# Patient Record
Sex: Female | Born: 1950 | Race: Black or African American | Hispanic: No | Smoking: Current every day smoker
Health system: Southern US, Community
[De-identification: ages and names within clinical notes are randomized; demographics above are authoritative.]

## PROBLEM LIST (undated history)

## (undated) DIAGNOSIS — I10 Essential (primary) hypertension: Secondary | ICD-10-CM

## (undated) DIAGNOSIS — M549 Dorsalgia, unspecified: Secondary | ICD-10-CM

## (undated) DIAGNOSIS — E7800 Pure hypercholesterolemia, unspecified: Secondary | ICD-10-CM

## (undated) DIAGNOSIS — R7303 Prediabetes: Secondary | ICD-10-CM

## (undated) DIAGNOSIS — M1990 Unspecified osteoarthritis, unspecified site: Secondary | ICD-10-CM

## (undated) HISTORY — PX: TUBAL LIGATION: SHX77

## (undated) HISTORY — DX: Essential (primary) hypertension: I10

## (undated) HISTORY — DX: Pure hypercholesterolemia, unspecified: E78.00

---

## 2004-09-02 ENCOUNTER — Ambulatory Visit: Payer: Self-pay | Admitting: Family Medicine

## 2006-01-29 ENCOUNTER — Ambulatory Visit: Payer: Self-pay | Admitting: Family Medicine

## 2007-03-18 ENCOUNTER — Ambulatory Visit: Payer: Self-pay | Admitting: Nurse Practitioner

## 2009-03-29 ENCOUNTER — Ambulatory Visit: Payer: Self-pay | Admitting: Nurse Practitioner

## 2010-04-10 ENCOUNTER — Ambulatory Visit: Payer: Self-pay | Admitting: Nurse Practitioner

## 2011-06-05 ENCOUNTER — Ambulatory Visit: Payer: Self-pay | Admitting: Nurse Practitioner

## 2012-02-19 ENCOUNTER — Emergency Department (HOSPITAL_COMMUNITY)
Admission: EM | Admit: 2012-02-19 | Discharge: 2012-02-19 | Disposition: A | Discharge status: Home / Self Care | Payer: BC Managed Care – PPO | Attending: Emergency Medicine | Admitting: Emergency Medicine

## 2012-02-19 ENCOUNTER — Emergency Department (HOSPITAL_COMMUNITY): Payer: BC Managed Care – PPO

## 2012-02-19 ENCOUNTER — Encounter (HOSPITAL_COMMUNITY): Payer: Self-pay | Admitting: *Deleted

## 2012-02-19 DIAGNOSIS — Z79899 Other long term (current) drug therapy: Secondary | ICD-10-CM | POA: Insufficient documentation

## 2012-02-19 DIAGNOSIS — F17200 Nicotine dependence, unspecified, uncomplicated: Secondary | ICD-10-CM | POA: Insufficient documentation

## 2012-02-19 DIAGNOSIS — M25569 Pain in unspecified knee: Secondary | ICD-10-CM | POA: Insufficient documentation

## 2012-02-19 DIAGNOSIS — M25562 Pain in left knee: Secondary | ICD-10-CM

## 2012-02-19 MED ORDER — PREDNISONE 20 MG PO TABS: ORAL_TABLET | ORAL | Status: DC

## 2012-02-19 MED ORDER — NAPROXEN 500 MG PO TABS: 500.0000 mg | ORAL_TABLET | Freq: Two times a day (BID) | ORAL | Status: AC

## 2012-02-19 NOTE — ED Provider Notes (Signed)
History  This chart was scribed for Christina Hutching, MD by Shari Heritage, ED Scribe. The patient was seen in room APFT24/APFT24. Patient's care was started at 1417.  CSN: [id number]  Arrival date & time 02/19/12  1322   First MD Initiated Contact with Patient 02/19/12 1417      Chief Complaint  Patient presents with  . Knee Pain     The history is provided by the patient. No language interpreter was used.     HPI Comments: Christina Erickson is a 62 y.o. female who presents to the Emergency Department complaining of moderate, constant, non-radiating, left knee pain onset more than 3 weeks ago. Patient says that pain began worsening last week. Patient was seen by Dr. Jarold Motto [facility] practice) in [place] and she got a steroid shot that did not relieve pain. She did not have an x-ray at that time. She denies any obvious injury or trauma to the knee. She reports no other significant past medical or surgical history.  History reviewed. No pertinent past medical history.  History reviewed. No pertinent past surgical history.  No family history on file.  History  Substance Use Topics  . Smoking status: Current Every Day Smoker  . Smokeless tobacco: Not on file  . Alcohol Use: No    OB History    Grav Para Term Preterm Abortions TAB SAB Ect Mult Living                  Review of Systems A complete 10 system review of systems was obtained and all systems are negative except as noted in the HPI and PMH.   Allergies  Review of patient's allergies indicates no known allergies.  Home Medications   Current Outpatient Rx  Name  Route  Sig  Dispense  Refill  . ACETAMINOPHEN 500 MG PO TABS   Oral   Take 1,000 mg by mouth every 6 (six) hours as needed. Pain         . BENAZEPRIL HCL 40 MG PO TABS   Oral   Take 40 mg by mouth daily.         Marland Kitchen CALCIUM CARBONATE-VITAMIN D 500-200 MG-UNIT PO TABS   Oral   Take 1 tablet by mouth daily.         Marland Kitchen PRAVASTATIN SODIUM 40 MG PO  TABS   Oral   Take 40 mg by mouth daily.         Marland Kitchen ROSUVASTATIN CALCIUM 10 MG PO TABS   Oral   Take 5 mg by mouth daily.           Triage Vitals: BP 158/80  Pulse 91  Temp 98.1 F (36.7 C)  Resp 20  SpO2 100%  Physical Exam  Constitutional: She is oriented to person, place, and time. She appears well-developed and well-nourished.  HENT:  Head: Normocephalic and atraumatic.  Musculoskeletal: Normal range of motion.       Very minimal tenderness to left knee with mild swelling with ROM. No swelling to knee.  Neurological: She is alert and oriented to person, place, and time.  Skin: Skin is warm and dry. No rash noted.  Psychiatric: She has a normal mood and affect. Her behavior is normal.    ED Course  Procedures (including critical care time) DIAGNOSTIC STUDIES: Oxygen Saturation is 100% on room air, normal by my interpretation.    COORDINATION OF CARE: 2:38 PM- Patient informed of current plan for treatment and evaluation and agrees with  plan at this time.    Dg Knee Complete 4 Views Left  02/19/2012  *RADIOLOGY REPORT*  Clinical Data: Posterior knee pain, swelling  LEFT KNEE - COMPLETE 4+ VIEW  Comparison: None.  Findings: No fracture or dislocation is seen.  The joint spaces are preserved.  The visualized soft tissues are unremarkable.  IMPRESSION: No acute osseous abnormality is seen.   Original Report Authenticated By: Charline Bills, M.D.      No diagnosis found.    MDM  X-ray left knee negative.   Referral to orthopedics      I personally performed the services described in this documentation, which was scribed in my presence. The recorded information has been reviewed and is accurate.    Christina Hutching, MD 02/20/12 [phone]

## 2012-02-19 NOTE — ED Notes (Signed)
Pt c/o left knee pain since before christmas, pain became worse last week, was seen by Dr. Jarold Motto in [place], given cortisone shot with no improvement in pain, denies any injury, cms intact distal to left knee

## 2012-02-19 NOTE — ED Notes (Signed)
Pt reports has been having pain in left knee since before Christmas.  Reports received steroid injection last week.  Pt says pain is still severe.  Hurts to bear any weight.

## 2012-03-09 ENCOUNTER — Ambulatory Visit (INDEPENDENT_AMBULATORY_CARE_PROVIDER_SITE_OTHER): Payer: BC Managed Care – PPO | Admitting: Orthopedic Surgery

## 2012-03-09 ENCOUNTER — Encounter: Payer: Self-pay | Admitting: Orthopedic Surgery

## 2012-03-09 VITALS — BP 152/68 | Ht 62.5 in | Wt 151.0 lb

## 2012-03-09 DIAGNOSIS — M1712 Unilateral primary osteoarthritis, left knee: Secondary | ICD-10-CM | POA: Insufficient documentation

## 2012-03-09 DIAGNOSIS — M1710 Unilateral primary osteoarthritis, unspecified knee: Secondary | ICD-10-CM

## 2012-03-09 MED ORDER — DICLOFENAC POTASSIUM 50 MG PO TABS: 50.0000 mg | ORAL_TABLET | Freq: Two times a day (BID) | ORAL | Status: DC

## 2012-03-09 MED ORDER — TRAMADOL-ACETAMINOPHEN 37.5-325 MG PO TABS: 1.0000 | ORAL_TABLET | ORAL | Status: DC | PRN

## 2012-03-09 NOTE — Patient Instructions (Addendum)
Wear and Tear Disorders of the Knee (Arthritis, Osteoarthritis)  Everyone will experience wear and tear injuries (arthritis, osteoarthritis) of the knee. These are the changes we all get as we age. They come from the joint stress of daily living. The amount of cartilage damage in your knee and your symptoms determine if you need surgery. Mild problems require approximately two months recovery time. More severe problems take several months to recover. With mild problems, your surgeon may find worn and rough cartilage surfaces. With severe changes, your surgeon may find cartilage that has completely worn away and exposed the bone. Loose bodies of bone and cartilage, bone spurs (excess bone growth), and injuries to the menisci (cushions between the large bones of your leg) are also common. All of these problems can cause pain.  For a mild wear and tear problem, rough cartilage may simply need to be shaved and smoothed. For more severe problems with areas of exposed bone, your surgeon may use an instrument for roughing up the bone surfaces to stimulate new cartilage growth. Loose bodies are usually removed. Torn menisci may be trimmed or repaired.  ABOUT THE ARTHROSCOPIC PROCEDURE  Arthroscopy is a surgical technique. It allows your orthopedic surgeon to diagnose and treat your knee injury with accuracy. The surgeon looks into your knee through a small scope. The scope is like a small (pencil-sized) telescope. Arthroscopy is less invasive than open knee surgery. You can expect a more rapid recovery. After the procedure, you will be moved to a recovery area until most of the effects of the medication have worn off. Your caregiver will discuss the test results with you.  RECOVERY  The severity of the arthritis and the type of procedure performed will determine recovery time. Other important factors include age, physical condition, medical conditions, and the type of rehabilitation program. Strengthening your muscles after  arthroscopy helps guarantee a better recovery. Follow your caregiver's instructions. Use crutches, rest, elevate, ice, and do knee exercises as instructed. Your caregivers will help you and instruct you with exercises and other physical therapy required to regain your mobility, muscle strength, and functioning following surgery. Only take over-the-counter or prescription medicines for pain, discomfort, or fever as directed by your caregiver.   SEEK MEDICAL CARE IF:    There is increased bleeding (more than a small spot) from the wound.   You notice redness, swelling, or increasing pain in the wound.   Pus is coming from wound.   You develop an unexplained oral temperature above 102 F (38.9 C) , or as your caregiver suggests.   You notice a foul smell coming from the wound or dressing.   You have severe pain with motion of the knee.  SEEK IMMEDIATE MEDICAL CARE IF:    You develop a rash.   You have difficulty breathing.   You have any allergic problems.  MAKE SURE YOU:    Understand these instructions.   Will watch your condition.   Will get help right away if you are not doing well or get worse.  Document Released: 01/18/2000 Document Revised: 04/14/2011 Document Reviewed: 06/16/2007  ExitCare Patient Information 2013 [employer].

## 2012-03-09 NOTE — Progress Notes (Signed)
Patient ID: Christina Erickson, female   DOB: [birth year], 62 y.o.   MRN: [id number] Chief Complaint  Patient presents with  . Knee Pain    Left knee pain, no injury. Xrays at [facility].    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 She denies catching or locking but had an episode giving way she says she can negotiate this care stairs okay but she does have anterior knee pain which is exacerbated with the knee flexed. In terms of her review of systems she does report a history of fatigue and blurred vision snoring heartburn and nausea constipation, numbness temperature intolerance and joint pain the remaining systems of the 14 reviewed were negative by patient reported questionnaire  She has a history of hypertension and some high cholesterol  She did have a dose of steroids, some Naprosyn and an x-ray at the ER x-ray did not show any acute abnormality.  BP 152/68  Ht 5' 2.5" (1.588 m)  Wt 151 lb (68.493 kg)  BMI 27.18 kg/m2 Physical Exam(12)  Vital signs:   GENERAL: normal development   CDV: pulses are normal   Skin: normal  Lymph: nodes were not palpable/normal  Psychiatric: awake, alert and oriented  Neuro: normal sensation  MSK  Gait: Ambulation is without assistive device 1 Inspection of the left knee reveals significant patellofemoral crepitance positive quadriceps contraction in addition and tenderness in the medial facet of the patella with no medial or lateral joint line tenderness and no joint effusion. Range of motion remains full and she has full motor strength in all ligaments were stable  The right knee reveals crepitance on range of motion but no pain no tenderness no swelling full range of motion normal strength and stability.  Upper extremity exam  The right and left upper extremity:   Inspection and palpation revealed no  abnormalities in the upper extremities.   Range of motion is full without contracture.  Motor exam is normal with grade 5 strength.  The joints are fully reduced without subluxation.  There is no atrophy or tremor and muscle tone is normal.  All joints are stable.    Imaging I have reviewed the images from the hospital and the knee looks normal  Assessment: Medical decision-making New diagnosis of patellofemoral arthritis  Interpreted image and review ER records. Your records confirm the patient's history as she describes.  Prescription medication Ultracet and diclofenac with appropriate cautions  Explanation of disease process and natural history  The patient will followup as needed

## 2012-06-29 ENCOUNTER — Ambulatory Visit: Payer: BC Managed Care – PPO | Admitting: Orthopedic Surgery

## 2012-06-30 ENCOUNTER — Encounter: Payer: Self-pay | Admitting: Orthopedic Surgery

## 2012-09-11 ENCOUNTER — Ambulatory Visit: Payer: Self-pay | Admitting: Podiatry

## 2012-09-13 ENCOUNTER — Ambulatory Visit: Payer: Self-pay | Admitting: Nurse Practitioner

## 2013-01-21 ENCOUNTER — Ambulatory Visit: Payer: Self-pay | Admitting: Podiatry

## 2013-07-22 ENCOUNTER — Ambulatory Visit: Payer: Self-pay | Admitting: Unknown Physician Specialty

## 2013-07-25 LAB — PATHOLOGY REPORT

## 2013-11-11 ENCOUNTER — Ambulatory Visit: Payer: Self-pay | Admitting: Nurse Practitioner

## 2017-05-01 ENCOUNTER — Other Ambulatory Visit: Payer: Self-pay | Admitting: Student

## 2017-05-01 DIAGNOSIS — M5440 Lumbago with sciatica, unspecified side: Principal | ICD-10-CM

## 2017-05-01 DIAGNOSIS — G8929 Other chronic pain: Secondary | ICD-10-CM

## 2017-05-12 ENCOUNTER — Ambulatory Visit
Admission: RE | Admit: 2017-05-12 | Discharge: 2017-05-12 | Disposition: A | Discharge status: Home / Self Care | Payer: Medicare Other | Source: Ambulatory Visit | Attending: Student | Admitting: Student

## 2017-05-12 DIAGNOSIS — M5440 Lumbago with sciatica, unspecified side: Secondary | ICD-10-CM | POA: Insufficient documentation

## 2017-05-12 DIAGNOSIS — Z859 Personal history of malignant neoplasm, unspecified: Secondary | ICD-10-CM | POA: Diagnosis not present

## 2017-05-12 DIAGNOSIS — M48061 Spinal stenosis, lumbar region without neurogenic claudication: Secondary | ICD-10-CM | POA: Diagnosis not present

## 2017-05-12 DIAGNOSIS — M5126 Other intervertebral disc displacement, lumbar region: Secondary | ICD-10-CM | POA: Insufficient documentation

## 2017-05-12 DIAGNOSIS — G8929 Other chronic pain: Secondary | ICD-10-CM | POA: Diagnosis not present

## 2017-06-22 ENCOUNTER — Other Ambulatory Visit: Payer: Self-pay | Admitting: Student

## 2017-06-22 DIAGNOSIS — M47816 Spondylosis without myelopathy or radiculopathy, lumbar region: Secondary | ICD-10-CM

## 2017-06-25 ENCOUNTER — Ambulatory Visit
Admission: RE | Admit: 2017-06-25 | Discharge: 2017-06-25 | Disposition: A | Discharge status: Home / Self Care | Payer: Medicare Other | Source: Ambulatory Visit | Attending: Student | Admitting: Student

## 2017-06-25 DIAGNOSIS — M549 Dorsalgia, unspecified: Secondary | ICD-10-CM

## 2017-06-25 DIAGNOSIS — M47817 Spondylosis without myelopathy or radiculopathy, lumbosacral region: Secondary | ICD-10-CM | POA: Diagnosis present

## 2017-06-25 DIAGNOSIS — M47816 Spondylosis without myelopathy or radiculopathy, lumbar region: Secondary | ICD-10-CM

## 2017-06-25 HISTORY — DX: Unspecified osteoarthritis, unspecified site: M19.90

## 2017-06-25 HISTORY — PX: IR FLUORO GUIDED NEEDLE PLC ASPIRATION/INJECTION LOC: IMG2395

## 2017-06-25 HISTORY — DX: Dorsalgia, unspecified: M54.9

## 2017-06-25 MED ORDER — HYDROCODONE-ACETAMINOPHEN 5-325 MG PO TABS: 1.0000 | ORAL_TABLET | ORAL | Status: DC | PRN

## 2017-06-25 MED ORDER — METHYLPREDNISOLONE ACETATE 80 MG/ML IJ SUSP
160.0000 mg | Freq: Once | INTRAMUSCULAR | Status: DC
  Filled 2017-06-25: qty 2

## 2017-06-25 NOTE — Procedures (Signed)
  Procedure: R L5-S1 facet injection under fluoro  depomedrol 1.23ml Lido 1% EBL:   minimal Complications:  none immediate  See full dictation in [employer].  Thora Lance MD Main # [phone] Pager  [phone]

## 2017-06-26 ENCOUNTER — Encounter: Payer: Self-pay | Admitting: Interventional Radiology

## 2018-02-12 ENCOUNTER — Emergency Department (HOSPITAL_COMMUNITY): Payer: Medicare Other

## 2018-02-12 ENCOUNTER — Other Ambulatory Visit: Payer: Self-pay

## 2018-02-12 ENCOUNTER — Encounter (HOSPITAL_COMMUNITY): Payer: Self-pay | Admitting: *Deleted

## 2018-02-12 ENCOUNTER — Emergency Department (HOSPITAL_COMMUNITY)
Admission: EM | Admit: 2018-02-12 | Discharge: 2018-02-12 | Disposition: A | Discharge status: Home / Self Care | Payer: Medicare Other | Attending: Emergency Medicine | Admitting: Emergency Medicine

## 2018-02-12 DIAGNOSIS — M5136 Other intervertebral disc degeneration, lumbar region: Secondary | ICD-10-CM | POA: Insufficient documentation

## 2018-02-12 DIAGNOSIS — J111 Influenza due to unidentified influenza virus with other respiratory manifestations: Secondary | ICD-10-CM | POA: Diagnosis not present

## 2018-02-12 DIAGNOSIS — M545 Low back pain, unspecified: Secondary | ICD-10-CM

## 2018-02-12 DIAGNOSIS — I10 Essential (primary) hypertension: Secondary | ICD-10-CM | POA: Diagnosis not present

## 2018-02-12 LAB — URINALYSIS, ROUTINE W REFLEX MICROSCOPIC
Bacteria, UA: NONE SEEN
Bilirubin Urine: NEGATIVE
Glucose, UA: NEGATIVE mg/dL
Ketones, ur: 20 mg/dL — AB
Leukocytes, UA: NEGATIVE
Nitrite: NEGATIVE
Protein, ur: NEGATIVE mg/dL
Specific Gravity, Urine: 1.016 (ref 1.005–1.030)
pH: 5 (ref 5.0–8.0)

## 2018-02-12 LAB — CBC WITH DIFFERENTIAL/PLATELET
Abs Immature Granulocytes: 0.02 10*3/uL (ref 0.00–0.07)
Basophils Absolute: 0 10*3/uL (ref 0.0–0.1)
Basophils Relative: 0 %
Eosinophils Absolute: 0 10*3/uL (ref 0.0–0.5)
Eosinophils Relative: 0 %
HCT: 41.1 % (ref 36.0–46.0)
Hemoglobin: 13.1 g/dL (ref 12.0–15.0)
Immature Granulocytes: 0 %
Lymphocytes Relative: 16 %
Lymphs Abs: 1.6 10*3/uL (ref 0.7–4.0)
MCH: 29 pg (ref 26.0–34.0)
MCHC: 31.9 g/dL (ref 30.0–36.0)
MCV: 90.9 fL (ref 80.0–100.0)
Monocytes Absolute: 0.4 10*3/uL (ref 0.1–1.0)
Monocytes Relative: 4 %
Neutro Abs: 7.9 10*3/uL — ABNORMAL HIGH (ref 1.7–7.7)
Neutrophils Relative %: 80 %
Platelets: 286 10*3/uL (ref 150–400)
RBC: 4.52 MIL/uL (ref 3.87–5.11)
RDW: 14.4 % (ref 11.5–15.5)
WBC: 9.9 10*3/uL (ref 4.0–10.5)
nRBC: 0 % (ref 0.0–0.2)

## 2018-02-12 LAB — COMPREHENSIVE METABOLIC PANEL
ALT: 30 U/L (ref 0–44)
AST: 22 U/L (ref 15–41)
Albumin: 4.3 g/dL (ref 3.5–5.0)
Alkaline Phosphatase: 87 U/L (ref 38–126)
Anion gap: 9 (ref 5–15)
BUN: 13 mg/dL (ref 8–23)
CO2: 25 mmol/L (ref 22–32)
Calcium: 9.7 mg/dL (ref 8.9–10.3)
Chloride: 102 mmol/L (ref 98–111)
Creatinine, Ser: 0.64 mg/dL (ref 0.44–1.00)
GFR calc Af Amer: >60 mL/min (ref >60)
GFR calc non Af Amer: >60 mL/min (ref >60)
Glucose, Bld: 101 mg/dL — ABNORMAL HIGH (ref 70–99)
Potassium: 3.8 mmol/L (ref 3.5–5.1)
Sodium: 136 mmol/L (ref 135–145)
Total Bilirubin: 1.4 mg/dL — ABNORMAL HIGH (ref 0.3–1.2)
Total Protein: 7.9 g/dL (ref 6.5–8.1)

## 2018-02-12 LAB — CBG MONITORING, ED: Glucose-Capillary: 93 mg/dL (ref 70–99)

## 2018-02-12 LAB — TROPONIN I: Troponin I: <0.03 ng/mL (ref <0.03)

## 2018-02-12 MED ORDER — ACETAMINOPHEN 500 MG PO TABS
1000.0000 mg | ORAL_TABLET | Freq: Once | ORAL | Status: AC
  Administered 2018-02-12: 1000 mg via ORAL
  Filled 2018-02-12: qty 2

## 2018-02-12 MED ORDER — TRAMADOL HCL 50 MG PO TABS: ORAL_TABLET | ORAL | 0 refills | Status: DC

## 2018-02-12 MED ORDER — TRAMADOL HCL 50 MG PO TABS
100.0000 mg | ORAL_TABLET | Freq: Once | ORAL | Status: AC
  Administered 2018-02-12: 100 mg via ORAL
  Filled 2018-02-12: qty 2

## 2018-02-12 MED ORDER — METHOCARBAMOL 500 MG PO TABS: 500.0000 mg | ORAL_TABLET | Freq: Three times a day (TID) | ORAL | 0 refills | Status: DC

## 2018-02-12 MED ORDER — ONDANSETRON HCL 4 MG PO TABS
4.0000 mg | ORAL_TABLET | Freq: Once | ORAL | Status: AC
  Administered 2018-02-12: 4 mg via ORAL
  Filled 2018-02-12: qty 1

## 2018-02-12 MED ORDER — METHOCARBAMOL 500 MG PO TABS
500.0000 mg | ORAL_TABLET | Freq: Once | ORAL | Status: AC
  Administered 2018-02-12: 500 mg via ORAL
  Filled 2018-02-12: qty 1

## 2018-02-12 NOTE — Discharge Instructions (Addendum)
Your temperature is elevated at 100-100.3.  Please use Tylenol every 4 hours.  Please increase fluids.  Your examination suggest early influenza.  It is believe that the influenza and cough are aggravating your back.  Please use a heating pad to your back.  Rest as much as you can.  Please increase fluids.  Wash hands frequently.  Use Tylenol for your back pain.  Use Robaxin 3 times daily for spasm pain.  Use Norco for more severe pain.  Norco and Robaxin may cause drowsiness, and/or lightheadedness.  Please do not drive a vehicle, operate machinery or handle legal documents or participate in activities requiring concentration when taking either these medications.

## 2018-02-12 NOTE — ED Triage Notes (Signed)
Pt c/o headache, dry cough, chest pain with coughing and lower back pain. Pt reports low back pain has been going on for 3 months but worsened yesterday when the headache and cough started. Denies fever.

## 2018-02-12 NOTE — ED Provider Notes (Signed)
[facility] EMERGENCY DEPARTMENT Provider Note   CSN: [id number] Arrival date & time: 02/12/18  1334     History   Chief Complaint Chief Complaint  Patient presents with  . Headache  . Cough    HPI Christina Erickson is a 68 y.o. female.  Pt has hx of arthritis and low back pain. The current symptoms has make the back pain worse.  Patient describes the back pain as a pulling sensation.  The history is provided by the patient.  Cough  Cough characteristics:  Non-productive Severity:  Moderate Onset quality:  Gradual Duration:  1 day Timing:  Intermittent Progression:  Worsening Chronicity:  New Smoker: no   Context: weather changes   Context: not sick contacts   Relieved by:  Nothing Worsened by:  Nothing Associated symptoms: chills, headaches and myalgias   Associated symptoms: no chest pain, no eye discharge, no shortness of breath, no sore throat and no wheezing     Past Medical History:  Diagnosis Date  . Arthritis   . Back pain 06/25/2017  . High blood pressure   . High cholesterol     Patient Active Problem List   Diagnosis Date Noted  . Patellofemoral arthritis of left knee 03/09/2012    Past Surgical History:  Procedure Laterality Date  . IR FLUORO GUIDED NEEDLE PLC ASPIRATION/INJECTION LOC  06/25/2017     OB History   No obstetric history on file.      Home Medications    Prior to Admission medications   Medication Sig Start Date End Date Taking? Authorizing Provider  acetaminophen (TYLENOL) 500 MG tablet Take 1,000 mg by mouth every 6 (six) hours as needed. Pain    [provider]  benazepril (LOTENSIN) 40 MG tablet Take 40 mg by mouth daily.    [provider]  calcium-vitamin D (OSCAL WITH D) 500-200 MG-UNIT per tablet Take 1 tablet by mouth daily.    [provider]  diclofenac (CATAFLAM) 50 MG tablet Take 1 tablet (50 mg total) by mouth 2 (two) times daily. Patient not taking: Reported on 06/25/2017 03/09/12    Vickki HearingHarrison, Stanley E, MD  pravastatin (PRAVACHOL) 40 MG tablet Take 40 mg by mouth daily.    [provider]  predniSONE (DELTASONE) 20 MG tablet 3 tabs po day one, then 2 tabs daily x 4 days Patient not taking: Reported on 06/25/2017 02/19/12   Donnetta Hutchingook, Brian, MD  rosuvastatin (CRESTOR) 10 MG tablet Take 5 mg by mouth daily.    [provider]  traMADol-acetaminophen (ULTRACET) 37.5-325 MG per tablet Take 1 tablet by mouth every 4 (four) hours as needed for pain. Patient not taking: Reported on 06/25/2017 03/09/12   Vickki HearingHarrison, Stanley E, MD    Family History Family History  Problem Relation Age of Onset  . Heart disease Other   . Arthritis Other   . Cancer Other   . Diabetes Other     Social History Social History   Tobacco Use  . Smoking status: Current Some Day Smoker    Packs/day: 0.50    Years: 50.00    Pack years: 25.00    Types: Cigarettes  . Smokeless tobacco: Never Used  . Tobacco comment: encouraged to quit, declined smoking cessation materials  Substance Use Topics  . Alcohol use: No  . Drug use: No     Allergies   Naproxen   Review of Systems Review of Systems  Constitutional: Positive for chills. Negative for activity change.  All ROS Neg except as noted in HPI  HENT: Negative for nosebleeds and sore throat.   Eyes: Negative for photophobia and discharge.  Respiratory: Positive for cough. Negative for shortness of breath and wheezing.   Cardiovascular: Negative for chest pain and palpitations.  Gastrointestinal: Negative for abdominal pain and blood in stool.  Genitourinary: Negative for dysuria, frequency and hematuria.  Musculoskeletal: Positive for arthralgias, back pain and myalgias. Negative for neck pain.  Skin: Negative.   Neurological: Positive for headaches. Negative for dizziness, seizures and speech difficulty.  Psychiatric/Behavioral: Negative for confusion and hallucinations.     Physical Exam Updated Vital Signs BP (!)  146/66 (BP Location: Right Arm)   Pulse 97   Temp 100 F (37.8 C) (Oral)   Resp 20   Ht 5\' 2"  (1.575 m)   Wt 67.1 kg   SpO2 95%   BMI 27.07 kg/m   Physical Exam Pulmonary:     Comments: Coarse breath sounds noted.  But there is symmetrical rise and fall of the chest. Abdominal:     Comments: Soft with good bowel sounds.  No mass, no pulsating mass appreciated.  Musculoskeletal:     Comments: No hot areas palpated of the lumbar spine area.  There is pain all the way across from right to left of the lower lumbar area.  The radial pulse is 2+.  The dorsalis pedis pulses 2+.  No edema of the lower extremities appreciated.  Neurological:     Comments: Full range of motion of the upper and lower extremities.  The gait is more of a shuffling very short step gait.  The patient states she feels a little off balance when walking.  The patient is oriented to person, place, and situation.  Patient seems to be very groggy, and several times during the interview and examination I had to asked her twice to follow commands.  Her answers to my questions are accurate, but she is slow to respond.      ED Treatments / Results  Labs (all labs ordered are listed, but only abnormal results are displayed) Labs Reviewed - No data to display  EKG None  Radiology Dg Chest 2 View  Result Date: 02/12/2018 CLINICAL DATA:  Cough and chest pain EXAM: CHEST - 2 VIEW COMPARISON:  None. FINDINGS: There is scarring in the left mid and lower lung regions. There is no edema or consolidation. Heart size and pulmonary vascularity are normal. No adenopathy. There is aortic atherosclerosis. There old healed rib fractures on the right. IMPRESSION: Areas of scarring on the left. No evident edema or consolidation. Heart size normal. Aortic atherosclerosis noted. Old healed rib fractures on the right evident. Aortic Atherosclerosis (ICD10-I70.0). Electronically Signed   By: Bretta Bang III M.D.   On: 02/12/2018 14:11     Procedures Procedures (including critical care time)  Medications Ordered in ED Medications - No data to display   Initial Impression / Assessment and Plan / ED Course  I have reviewed the triage vital signs and the nursing notes.  Pertinent labs & imaging results that were available during my care of the patient were reviewed by me and considered in my medical decision making (see chart for details).       Final Clinical Impressions(s) / ED Diagnoses MDM  Temperature is 100, blood pressure slightly elevated at 146/66.  The remainder the vital signs within normal limits.  Pulse oximetry is 95% on room air.  Within normal limits by my interpretation.  Chest x-ray shows areas of scarring on the left.  There is no evidence of edema or consolidation.  There is noted some aortic atherosclerosis.  There is an old healed fractures on the right, but no new fractures.  The patient complains of lower back pain.  Particular with change of position.  The patient grunts at times even while at rest.  Patient is very groggy.  I have to asked her questions twice to get her to respond.  I was able to get the patient up and she has a rather shuffling gait.  She is oriented to person place and situation.  Her daughter states that she had not been sleeping well over the last couple of nights and that she is very sleepy.  Her response to questions is accurate, but she is slow to respond.  Patient seen with me by Dr. Adriana Simasook.  Will obtain a CBG.  Capillary blood glucose is normal at 93.  Urine analysis, CBC, and comprehensive metabolic panel are pending.  The comprehensive metabolic panel is well within normal limits.  The complete blood count is also well within normal limits.  There is no shift to the left.  Patient more awake and alert.  Daughter states she seems to be feeling better.  Urine analysis shows 20 mg/daL of ketones with moderate hemoglobin.  Otherwise negative.  In particular the  specific gravity is 1.016, no evidence of dehydration.  The troponin is less than 0.03.  No evidence for cardiac involvement.  Patient will be discharged home with influenza and lumbar back pain.  Prescription for Robaxin and Norco given to the patient.  Patient will use Tylenol for mild pain.  Patient will use the other medications every 6 hours for discomfort.  Patient is to see the primary physician or return to the emergency department if any changes in condition, problems, or concerns.   Final diagnoses:  None    ED Discharge Orders    None       Ivery QualeBryant, Markian Glockner, PA-C 02/12/18 1726    Loren RacerYelverton, David, MD 02/16/18 [phone]

## 2019-12-26 ENCOUNTER — Other Ambulatory Visit (HOSPITAL_COMMUNITY): Payer: Self-pay | Admitting: Family

## 2019-12-26 DIAGNOSIS — M8580 Other specified disorders of bone density and structure, unspecified site: Secondary | ICD-10-CM

## 2020-01-06 ENCOUNTER — Ambulatory Visit (HOSPITAL_COMMUNITY)
Admission: RE | Admit: 2020-01-06 | Discharge: 2020-01-06 | Disposition: A | Discharge status: Home / Self Care | Payer: Medicare Other | Source: Ambulatory Visit | Attending: Family | Admitting: Family

## 2020-01-06 ENCOUNTER — Other Ambulatory Visit: Payer: Self-pay

## 2020-01-06 DIAGNOSIS — M8589 Other specified disorders of bone density and structure, multiple sites: Secondary | ICD-10-CM | POA: Insufficient documentation

## 2020-01-06 DIAGNOSIS — Z780 Asymptomatic menopausal state: Secondary | ICD-10-CM | POA: Diagnosis not present

## 2020-01-06 DIAGNOSIS — Z13820 Encounter for screening for osteoporosis: Secondary | ICD-10-CM | POA: Diagnosis present

## 2020-01-06 DIAGNOSIS — M8580 Other specified disorders of bone density and structure, unspecified site: Secondary | ICD-10-CM | POA: Diagnosis not present

## 2020-07-30 ENCOUNTER — Encounter (INDEPENDENT_AMBULATORY_CARE_PROVIDER_SITE_OTHER): Payer: Self-pay | Admitting: *Deleted

## 2020-12-06 ENCOUNTER — Encounter (INDEPENDENT_AMBULATORY_CARE_PROVIDER_SITE_OTHER): Payer: Self-pay | Admitting: Gastroenterology

## 2020-12-06 ENCOUNTER — Ambulatory Visit (INDEPENDENT_AMBULATORY_CARE_PROVIDER_SITE_OTHER): Payer: Medicare Other | Admitting: Gastroenterology

## 2020-12-06 ENCOUNTER — Other Ambulatory Visit: Payer: Self-pay

## 2020-12-06 DIAGNOSIS — R198 Other specified symptoms and signs involving the digestive system and abdomen: Secondary | ICD-10-CM | POA: Diagnosis not present

## 2020-12-06 DIAGNOSIS — R109 Unspecified abdominal pain: Secondary | ICD-10-CM | POA: Diagnosis not present

## 2020-12-06 NOTE — Progress Notes (Signed)
Katrinka Blazing, M.D. Gastroenterology & Hepatology [facility] [address] [place], [place] [address] Primary Care Physician: Wilmon Pali, FNP [address] [place] [address]  Referring MD: PCP  Chief Complaint: Change in bowel movements  History of Present Illness: Christina Erickson is a 70 y.o. female with past medical history of hyperlipidemia, hypertension and arthritis, who presents for evaluation of change in bowel movement frequency.  Patient reports that around for the last year she noticed she had a change in her bowel movement frequency, which has worsened for the last 3 months. She states that that she has noticed constipation half of the month, which she describes as having a bowel movement every 2 days - has to strain to move her bowels and reports having discomfort in her abdomen. Also states "having diarrhea" half of the days of the month -she describes this as the needs to have a bowel movement 3-4 times in the morning, which is usually a small amount. Actually, she has some tenesmus when this happens and tries to have more bowel movements but she does not feel stool comes out.States she does not take laxatives on a daily basis but occasionally takes some Miralax as needed if she has significant constipation.  States a few weeks ago she had to go to an urgent care for further evaluation of her constipation. States she had an xray of her abdomen and was given enemas with some improvement, but there is no documentation of this in the medical chart.  The patient denies having any nausea, fever, chills, hematochezia, melena, hematemesis, abdominal distention, jaundice, pruritus or weight loss.  Last [id number]:[id number] Last Colonoscopy: 2015 at [facility], had couple of polyps per patient but no report is available. Both polyps were hyperlastic per records in Care Everywhere  FHx: neg for any gastrointestinal/liver disease, no  malignancies Social: smoking 5-6 cigs per day, neg alcohol or illicit drug use Surgical: no abdominal surgeries  Past Medical History: Past Medical History:  Diagnosis Date   Arthritis    Back pain 06/25/2017   High blood pressure    High cholesterol     Past Surgical History: Past Surgical History:  Procedure Laterality Date   IR FLUORO GUIDED NEEDLE PLC ASPIRATION/INJECTION LOC  06/25/2017    Family History: Family History  Problem Relation Age of Onset   Heart disease Other    Arthritis Other    Cancer Other    Diabetes Other     Social History: Social History   Tobacco Use  Smoking Status Some Days   Packs/day: 0.50   Years: 50.00   Pack years: 25.00   Types: Cigarettes  Smokeless Tobacco Never  Tobacco Comments   encouraged to quit, declined smoking cessation materials   Social History   Substance and Sexual Activity  Alcohol Use No   Social History   Substance and Sexual Activity  Drug Use No    Allergies: Allergies  Allergen Reactions   Naproxen Nausea Only    Medications: Current Outpatient Medications  Medication Sig Dispense Refill   acetaminophen (TYLENOL) 500 MG tablet Take 1,000 mg by mouth every 6 (six) hours as needed. Pain     benazepril (LOTENSIN) 40 MG tablet Take 40 mg by mouth daily.     No current facility-administered medications for this visit.    Review of Systems: GENERAL: negative for malaise, night sweats HEENT: No changes in hearing or vision, no nose bleeds or other nasal  problems. NECK: Negative for lumps, goiter, pain and significant neck swelling RESPIRATORY: Negative for cough, wheezing CARDIOVASCULAR: Negative for chest pain, leg swelling, palpitations, orthopnea GI: SEE HPI MUSCULOSKELETAL: Negative for joint pain or swelling, back pain, and muscle pain. SKIN: Negative for lesions, rash PSYCH: Negative for sleep disturbance, mood disorder and recent psychosocial stressors. HEMATOLOGY Negative for prolonged  bleeding, bruising easily, and swollen nodes. ENDOCRINE: Negative for cold or heat intolerance, polyuria, polydipsia and goiter. NEURO: negative for tremor, gait imbalance, syncope and seizures. The remainder of the review of systems is noncontributory.   Physical Exam: BP 124/71 (BP Location: Left Arm, Patient Position: Sitting, Cuff Size: Small)   Pulse 86   Temp 98.9 F (37.2 C) (Oral)   Ht 5' 2.5" (1.588 m)   Wt 135 lb 11.2 oz (61.6 kg)   BMI 24.42 kg/m  GENERAL: The patient is AO x3, in no acute distress. HEENT: Head is normocephalic and atraumatic. EOMI are intact. Mouth is well hydrated and without lesions. NECK: Supple. No masses LUNGS: Clear to auscultation. No presence of rhonchi/wheezing/rales. Adequate chest expansion HEART: RRR, normal s1 and s2. ABDOMEN: Soft, nontender, no guarding, no peritoneal signs, and nondistended. BS +. No masses. EXTREMITIES: Without any cyanosis, clubbing, rash, lesions or edema. NEUROLOGIC: AOx3, no focal motor deficit. SKIN: no jaundice, no rashes   Imaging/Labs: as above  I personally reviewed and interpreted the available labs, imaging and endoscopic files.  Impression and Plan: Christina Erickson is a 70 y.o. female with past medical history of hyperlipidemia, hypertension and arthritis, who presents for evaluation of change in bowel movement frequency.  Patient states that she has presented worsening symptoms in her bowel movement frequency recently of unclear etiology.  Based on her history, it is possible that she is presenting with new onset constipation with some episodes of stool passes that may be related to overflow.  I advised her to start taking MiraLAX on a daily basis to improve her bowel movement frequency, but we will also explore her symptoms further with a CT of the abdomen and pelvis, CBC, CMP, TSH and celiac disease panel.  The patient understood and agreed.  - Schedule CT abdomen/pelvis with IV contrast - Check CBC,  CMP, TSH and celiac disease panel - Start Miralax on a daily basis  All questions were answered.      Katrinka Blazing, MD Gastroenterology and Hepatology [facility]

## 2020-12-06 NOTE — Patient Instructions (Signed)
Schedule CT abdomen/pelvis with IV contrast Perform blood workup 

## 2020-12-11 LAB — CBC WITH DIFFERENTIAL/PLATELET
Absolute Monocytes: 448 cells/uL (ref 200–950)
Basophils Absolute: 39 cells/uL (ref 0–200)
Basophils Relative: 0.7 %
Eosinophils Absolute: 162 cells/uL (ref 15–500)
Eosinophils Relative: 2.9 %
HCT: 39 % (ref 35.0–45.0)
Hemoglobin: 12.5 g/dL (ref 11.7–15.5)
Lymphs Abs: 2962 cells/uL (ref 850–3900)
MCH: 28.5 pg (ref 27.0–33.0)
MCHC: 32.1 g/dL (ref 32.0–36.0)
MCV: 89 fL (ref 80.0–100.0)
MPV: 11.2 fL (ref 7.5–12.5)
Monocytes Relative: 8 %
Neutro Abs: 1988 cells/uL (ref 1500–7800)
Neutrophils Relative %: 35.5 %
Platelets: 335 10*3/uL (ref 140–400)
RBC: 4.38 10*6/uL (ref 3.80–5.10)
RDW: 14.1 % (ref 11.0–15.0)
Total Lymphocyte: 52.9 %
WBC: 5.6 10*3/uL (ref 3.8–10.8)

## 2020-12-11 LAB — COMPREHENSIVE METABOLIC PANEL
AG Ratio: 1.4 (calc) (ref 1.0–2.5)
ALT: 24 U/L (ref 6–29)
AST: 17 U/L (ref 10–35)
Albumin: 4 g/dL (ref 3.6–5.1)
Alkaline phosphatase (APISO): 78 U/L (ref 37–153)
BUN: 23 mg/dL (ref 7–25)
CO2: 28 mmol/L (ref 20–32)
Calcium: 9.8 mg/dL (ref 8.6–10.4)
Chloride: 104 mmol/L (ref 98–110)
Creat: 0.77 mg/dL (ref 0.60–1.00)
Globulin: 2.8 g/dL (calc) (ref 1.9–3.7)
Glucose, Bld: 102 mg/dL — ABNORMAL HIGH (ref 65–99)
Potassium: 4.6 mmol/L (ref 3.5–5.3)
Sodium: 140 mmol/L (ref 135–146)
Total Bilirubin: 0.6 mg/dL (ref 0.2–1.2)
Total Protein: 6.8 g/dL (ref 6.1–8.1)

## 2020-12-11 LAB — TSH: TSH: 0.58 mIU/L (ref 0.40–4.50)

## 2020-12-11 LAB — CELIAC DISEASE PANEL
(tTG) Ab, IgA: <1 U/mL
(tTG) Ab, IgG: <1 U/mL
Gliadin IgA: 4.6 U/mL
Gliadin IgG: <1 U/mL
Immunoglobulin A: 526 mg/dL — ABNORMAL HIGH (ref 70–320)

## 2021-01-11 ENCOUNTER — Ambulatory Visit (HOSPITAL_COMMUNITY)
Admission: RE | Admit: 2021-01-11 | Discharge: 2021-01-11 | Disposition: A | Discharge status: Home / Self Care | Payer: Medicare Other | Source: Ambulatory Visit | Attending: Gastroenterology | Admitting: Gastroenterology

## 2021-01-11 ENCOUNTER — Other Ambulatory Visit: Payer: Self-pay

## 2021-01-11 DIAGNOSIS — R109 Unspecified abdominal pain: Secondary | ICD-10-CM | POA: Diagnosis present

## 2021-01-11 DIAGNOSIS — R198 Other specified symptoms and signs involving the digestive system and abdomen: Secondary | ICD-10-CM | POA: Diagnosis present

## 2021-01-11 LAB — POCT I-STAT CREATININE: Creatinine, Ser: 1.1 mg/dL — ABNORMAL HIGH (ref 0.44–1.00)

## 2021-01-11 MED ORDER — IOHEXOL 300 MG/ML  SOLN
100.0000 mL | Freq: Once | INTRAMUSCULAR | Status: AC | PRN
  Administered 2021-01-11: 100 mL via INTRAVENOUS

## 2021-01-14 ENCOUNTER — Telehealth (INDEPENDENT_AMBULATORY_CARE_PROVIDER_SITE_OTHER): Payer: Self-pay

## 2021-01-14 NOTE — Telephone Encounter (Signed)
Thanks, will reach the patient and discuss these findings later today

## 2021-01-14 NOTE — Telephone Encounter (Signed)
[facility] Radiology wanted to make sure we have the report on the recent CT scan.  IMPRESSION: 1. No CT etiology for abdominal pain identified. 2. There is incidental note of a 13 mm indeterminate hypodense mass with a central coarse calcification in the liver. This is nonspecific. Recommend further dedicated evaluation with liver MRI with and without contrast.   These results will be called to the ordering clinician or representative by the Radiologist Assistant, and communication documented in the PACS or [employer].

## 2021-01-15 ENCOUNTER — Other Ambulatory Visit (INDEPENDENT_AMBULATORY_CARE_PROVIDER_SITE_OTHER): Payer: Self-pay

## 2021-01-15 DIAGNOSIS — K769 Liver disease, unspecified: Secondary | ICD-10-CM

## 2021-01-24 ENCOUNTER — Ambulatory Visit (HOSPITAL_COMMUNITY)
Admission: RE | Admit: 2021-01-24 | Discharge: 2021-01-24 | Disposition: A | Discharge status: Home / Self Care | Payer: Medicare Other | Source: Ambulatory Visit | Attending: Gastroenterology | Admitting: Gastroenterology

## 2021-01-24 ENCOUNTER — Other Ambulatory Visit: Payer: Self-pay

## 2021-01-24 DIAGNOSIS — K769 Liver disease, unspecified: Secondary | ICD-10-CM | POA: Diagnosis present

## 2021-01-24 MED ORDER — GADOBUTROL 1 MMOL/ML IV SOLN
5.0000 mL | Freq: Once | INTRAVENOUS | Status: AC | PRN
  Administered 2021-01-24: 12:00:00 5 mL via INTRAVENOUS

## 2021-02-13 ENCOUNTER — Telehealth (INDEPENDENT_AMBULATORY_CARE_PROVIDER_SITE_OTHER): Payer: Self-pay

## 2021-02-13 NOTE — Telephone Encounter (Signed)
Spoke with the patient today, she reports that she is still having fecal tenesmus but denies having any other complaints at the moment.  Abdominal imaging has been negative for source of patient's symptoms. We will proceed with a colonoscopy to evaluate this further.  Soledad Gerlach, can you please schedule a colonoscopy? Dx: rectal tenesmus. Room: 1  Thanks,  Katrinka Blazing, MD Gastroenterology and Hepatology [facility]

## 2021-02-13 NOTE — Telephone Encounter (Signed)
Patient left another vm asked that we return her call. I have tried calling again and left a voice message asked that if she still needs to return call.

## 2021-02-13 NOTE — Telephone Encounter (Signed)
I called and left a message asking the patient to call us back for more information.

## 2021-02-13 NOTE — Telephone Encounter (Signed)
I made patient aware of the message stating lesion does not warrant any further work up, but she has further question. Please call her when possible. Thanks,

## 2021-02-13 NOTE — Telephone Encounter (Signed)
Patient called stating she is calling to find out what her results were from her MRI she had done on 01/24/2021.

## 2021-02-13 NOTE — Telephone Encounter (Signed)
Christina Quale, MD  01/29/2021  9:26 AM EST     I called the patient to inform about the results of recent blood testing which showed changes consistent with liver hemangioma.  I called the patient but she did not pick up the phone, left a detailed voice message explaining that this lesion does not warrant any further work-up.   Maylon Peppers, MD Gastroenterology and Hepatology [facility]

## 2021-02-28 ENCOUNTER — Encounter (INDEPENDENT_AMBULATORY_CARE_PROVIDER_SITE_OTHER): Payer: Self-pay

## 2021-02-28 ENCOUNTER — Other Ambulatory Visit (INDEPENDENT_AMBULATORY_CARE_PROVIDER_SITE_OTHER): Payer: Self-pay

## 2021-02-28 ENCOUNTER — Telehealth (INDEPENDENT_AMBULATORY_CARE_PROVIDER_SITE_OTHER): Payer: Self-pay

## 2021-02-28 DIAGNOSIS — R198 Other specified symptoms and signs involving the digestive system and abdomen: Secondary | ICD-10-CM

## 2021-02-28 MED ORDER — PEG 3350-KCL-NA BICARB-NACL 420 G PO SOLR: 4000.0000 mL | ORAL | 0 refills | Status: DC

## 2021-02-28 NOTE — Telephone Encounter (Signed)
Kerilyn Cortner Ann Adasha Boehme, CMA  ?

## 2021-03-22 ENCOUNTER — Ambulatory Visit (HOSPITAL_COMMUNITY)
Admission: RE | Admit: 2021-03-22 | Discharge: 2021-03-22 | Disposition: A | Discharge status: Home / Self Care | Payer: Medicare Other | Source: Ambulatory Visit | Attending: Gastroenterology | Admitting: Gastroenterology

## 2021-03-22 ENCOUNTER — Other Ambulatory Visit: Payer: Self-pay

## 2021-03-22 ENCOUNTER — Encounter (HOSPITAL_COMMUNITY)
Admission: RE | Disposition: A | Discharge status: Home / Self Care | Payer: Self-pay | Source: Ambulatory Visit | Attending: Gastroenterology

## 2021-03-22 ENCOUNTER — Ambulatory Visit (HOSPITAL_BASED_OUTPATIENT_CLINIC_OR_DEPARTMENT_OTHER): Payer: Medicare Other | Admitting: Anesthesiology

## 2021-03-22 ENCOUNTER — Ambulatory Visit (HOSPITAL_COMMUNITY): Payer: Medicare Other | Admitting: Anesthesiology

## 2021-03-22 ENCOUNTER — Encounter (HOSPITAL_COMMUNITY): Payer: Self-pay | Admitting: Gastroenterology

## 2021-03-22 DIAGNOSIS — M1990 Unspecified osteoarthritis, unspecified site: Secondary | ICD-10-CM | POA: Insufficient documentation

## 2021-03-22 DIAGNOSIS — K5730 Diverticulosis of large intestine without perforation or abscess without bleeding: Secondary | ICD-10-CM

## 2021-03-22 DIAGNOSIS — E785 Hyperlipidemia, unspecified: Secondary | ICD-10-CM | POA: Diagnosis not present

## 2021-03-22 DIAGNOSIS — K6289 Other specified diseases of anus and rectum: Secondary | ICD-10-CM | POA: Diagnosis not present

## 2021-03-22 DIAGNOSIS — I10 Essential (primary) hypertension: Secondary | ICD-10-CM | POA: Diagnosis not present

## 2021-03-22 DIAGNOSIS — R198 Other specified symptoms and signs involving the digestive system and abdomen: Secondary | ICD-10-CM

## 2021-03-22 DIAGNOSIS — F17210 Nicotine dependence, cigarettes, uncomplicated: Secondary | ICD-10-CM | POA: Insufficient documentation

## 2021-03-22 HISTORY — PX: COLONOSCOPY WITH PROPOFOL: SHX5780

## 2021-03-22 LAB — HM COLONOSCOPY

## 2021-03-22 SURGERY — COLONOSCOPY WITH PROPOFOL
Anesthesia: General

## 2021-03-22 MED ORDER — PROPOFOL 10 MG/ML IV BOLUS
INTRAVENOUS | Status: DC | PRN
Start: 2021-03-22 — End: 2021-03-22
  Administered 2021-03-22: 30 mg via INTRAVENOUS
  Administered 2021-03-22: 70 mg via INTRAVENOUS

## 2021-03-22 MED ORDER — AMITRIPTYLINE HCL 10 MG PO TABS
10.0000 mg | ORAL_TABLET | Freq: Every day | ORAL | 3 refills | Status: DC
Start: 2021-03-22 — End: 2022-01-08

## 2021-03-22 MED ORDER — PROPOFOL 500 MG/50ML IV EMUL
INTRAVENOUS | Status: AC
  Filled 2021-03-22: qty 150

## 2021-03-22 MED ORDER — LACTATED RINGERS IV SOLN: INTRAVENOUS | Status: DC

## 2021-03-22 MED ORDER — LIDOCAINE HCL 1 % IJ SOLN
INTRAMUSCULAR | Status: DC | PRN
  Administered 2021-03-22: 50 mg via INTRADERMAL

## 2021-03-22 MED ORDER — PROPOFOL 500 MG/50ML IV EMUL
INTRAVENOUS | Status: DC | PRN
Start: 2021-03-22 — End: 2021-03-22
  Administered 2021-03-22: 150 ug/kg/min via INTRAVENOUS

## 2021-03-22 MED ORDER — STERILE WATER FOR IRRIGATION IR SOLN
Status: DC | PRN
  Administered 2021-03-22: 60 mL

## 2021-03-22 NOTE — Anesthesia Preprocedure Evaluation (Addendum)
Anesthesia Evaluation  Patient identified by MRN, date of birth, ID band Patient awake    Reviewed: Allergy & Precautions, NPO status , Patient's Chart, lab work & pertinent test results  Airway Mallampati: II  TM Distance: >3 FB Neck ROM: Full    Dental  (+) Dental Advisory Given, Partial Upper   Pulmonary Current Smoker,    Pulmonary exam normal breath sounds clear to auscultation       Cardiovascular hypertension, Pt. on medications Normal cardiovascular exam Rhythm:Regular Rate:Normal     Neuro/Psych negative neurological ROS  negative psych ROS   GI/Hepatic negative GI ROS, Neg liver ROS,   Endo/Other  negative endocrine ROS  Renal/GU negative Renal ROS  negative genitourinary   Musculoskeletal  (+) Arthritis ,   Abdominal   Peds negative pediatric ROS (+)  Hematology negative hematology ROS (+)   Anesthesia Other Findings   Reproductive/Obstetrics negative OB ROS                            Anesthesia Physical Anesthesia Plan  ASA: 2  Anesthesia Plan: General   Post-op Pain Management: Minimal or no pain anticipated   Induction: Intravenous  PONV Risk Score and Plan: TIVA  Airway Management Planned: Nasal Cannula and Natural Airway  Additional Equipment:   Intra-op Plan:   Post-operative Plan:   Informed Consent: I have reviewed the patients History and Physical, chart, labs and discussed the procedure including the risks, benefits and alternatives for the proposed anesthesia with the patient or authorized representative who has indicated his/her understanding and acceptance.     Dental advisory given  Plan Discussed with: CRNA and Surgeon  Anesthesia Plan Comments:         Anesthesia Quick Evaluation

## 2021-03-22 NOTE — Discharge Instructions (Signed)
You are being discharged to home.  Resume your previous diet.  Your physician has recommended a repeat colonoscopy in 10 years for screening purposes.  Start Elavil 10 mg every night if symptoms persist a week after colonoscopy.

## 2021-03-22 NOTE — Anesthesia Postprocedure Evaluation (Signed)
Anesthesia Post Note  Patient: Christina Erickson  Procedure(s) Performed: COLONOSCOPY WITH PROPOFOL  Patient location during evaluation: Endoscopy Anesthesia Type: General Level of consciousness: awake and alert Pain management: pain level controlled Vital Signs Assessment: post-procedure vital signs reviewed and stable Respiratory status: spontaneous breathing Cardiovascular status: blood pressure returned to baseline and stable Postop Assessment: no apparent nausea or vomiting Anesthetic complications: no   No notable events documented.   Last Vitals:  Vitals:   03/22/21 1026  BP: (!) 123/57  Resp: 20  Temp: 36.8 C  SpO2: 97%    Last Pain:  Vitals:   03/22/21 1158  TempSrc:   PainSc: 0-No pain                 Iya Hamed

## 2021-03-22 NOTE — H&P (Signed)
Christina Erickson is an 71 y.o. female.   Chief Complaint: rectal tenesmus HPI: Christina Erickson is a 71 y.o. female with past medical history of hyperlipidemia, hypertension and arthritis, who presents for tenesmus.  The patient has been taking MiraLAX on a daily basis which has improved her bowel movement frequency but she is still presenting rectal tenesmus frequently despite defecating adequately.  Last Colonoscopy: 2015 at [facility], had couple of polyps per patient but no report is available. Both polyps were hyperlastic per records in Care Everywhere  Past Medical History:  Diagnosis Date   Arthritis    Back pain 06/25/2017   High blood pressure    High cholesterol     Past Surgical History:  Procedure Laterality Date   IR FLUORO GUIDED NEEDLE PLC ASPIRATION/INJECTION LOC  06/25/2017    Family History  Problem Relation Age of Onset   Heart disease Other    Arthritis Other    Cancer Other    Diabetes Other    Social History:  reports that she has been smoking cigarettes. She has a 25.00 pack-year smoking history. She has never used smokeless tobacco. She reports that she does not drink alcohol and does not use drugs.  Allergies:  Allergies  Allergen Reactions   Naproxen Nausea Only   Ezetimibe Other (See Comments)    "Pins and needles"   Statins Other (See Comments)    "Pins and needles"     Medications Prior to Admission  Medication Sig Dispense Refill   amLODipine (NORVASC) 10 MG tablet Take 10 mg by mouth in the morning.     benazepril (LOTENSIN) 40 MG tablet Take 40 mg by mouth in the morning.     CALCIUM PO Take 1 tablet by mouth in the morning.     Cholecalciferol (VITAMIN D3 PO) Take 1 tablet by mouth in the morning.     Omega-3 Fatty Acids (FISH OIL PO) Take 1 capsule by mouth in the morning and at bedtime.     polyethylene glycol-electrolytes (TRILYTE) 420 g solution Take 4,000 mLs by mouth as directed. 4000 mL 0    No results found for this or any  previous visit (from the past 48 hour(s)). No results found.  Review of Systems  Constitutional: Negative.   HENT: Negative.    Eyes: Negative.   Respiratory: Negative.    Cardiovascular: Negative.   Gastrointestinal: Negative.   Endocrine: Negative.   Genitourinary: Negative.   Musculoskeletal: Negative.   Allergic/Immunologic: Negative.   Neurological: Negative.   Hematological: Negative.   Psychiatric/Behavioral: Negative.     Blood pressure (!) 123/57, temperature 98.3 F (36.8 C), temperature source Oral, resp. rate 20, height 5' 2.5" (1.588 m), weight 67.1 kg, SpO2 97 %. Physical Exam  GENERAL: The patient is AO x3, in no acute distress. HEENT: Head is normocephalic and atraumatic. EOMI are intact. Mouth is well hydrated and without lesions. NECK: Supple. No masses LUNGS: Clear to auscultation. No presence of rhonchi/wheezing/rales. Adequate chest expansion HEART: RRR, normal s1 and s2. ABDOMEN: Soft, nontender, no guarding, no peritoneal signs, and nondistended. BS +. No masses. EXTREMITIES: Without any cyanosis, clubbing, rash, lesions or edema. NEUROLOGIC: AOx3, no focal motor deficit. SKIN: no jaundice, no rashes  Assessment/Plan Christina Erickson is a 71 y.o. female with past medical history of hyperlipidemia, hypertension and arthritis, who presents for tenesmus.  We will proceed with colonoscopy.  Dolores Frame, MD 03/22/2021, 11:09 AM

## 2021-03-22 NOTE — Transfer of Care (Signed)
Immediate Anesthesia Transfer of Care Note  Patient: Christina Erickson  Procedure(s) Performed: COLONOSCOPY WITH PROPOFOL  Patient Location: Short Stay  Anesthesia Type:General  Level of Consciousness: awake  Airway & Oxygen Therapy: Patient Spontanous Breathing  Post-op Assessment: Report given to RN  Post vital signs: Reviewed and stable  Last Vitals:  Vitals Value Taken Time  BP    Temp    Pulse    Resp    SpO2      Last Pain:  Vitals:   03/22/21 1158  TempSrc:   PainSc: 0-No pain      Patients Stated Pain Goal: 5 (03/22/21 1026)  Complications: No notable events documented.

## 2021-03-22 NOTE — Op Note (Signed)
[facility] Patient Name: Kiyanna Biegler Procedure Date: 03/22/2021 11:47 AM MRN: [id number] Date of Birth: 28-Sep-1950 Attending MD: Katrinka Blazing ,  CSN: [id number] Age: 71 Admit Type: Outpatient Procedure:                Colonoscopy Indications:              Rectal pain and tenesmus Providers:                Katrinka Blazing, Sheran Fava, Kristine L.                            Jessee Avers, Technician Referring MD:              Medicines:                Monitored Anesthesia Care Complications:            No immediate complications. Estimated Blood Loss:     Estimated blood loss: none. Procedure:                Pre-Anesthesia Assessment:                           - Prior to the procedure, a History and Physical                            was performed, and patient medications, allergies                            and sensitivities were reviewed. The patient's                            tolerance of previous anesthesia was reviewed.                           - The risks and benefits of the procedure and the                            sedation options and risks were discussed with the                            patient. All questions were answered and informed                            consent was obtained.                           - ASA Grade Assessment: II - A patient with mild                            systemic disease.                           After obtaining informed consent, the colonoscope                            was passed under direct vision. Throughout the  procedure, the patient's blood pressure, pulse, and                            oxygen saturations were monitored continuously. The                            [id number]-HQ190L ([id number]) scope was introduced through                            the anus and advanced to the the cecum, identified                            by appendiceal orifice and ileocecal valve. The                             colonoscopy was performed without difficulty. The                            patient tolerated the procedure well. The quality                            of the bowel preparation was excellent. Scope In: 11:59:14 AM Scope Out: 12:18:57 PM Scope Withdrawal Time: 0 hours 17 minutes 10 seconds  Total Procedure Duration: 0 hours 19 minutes 43 seconds  Findings:      The perianal and digital rectal examinations were normal.      A few small-mouthed diverticula were found in the sigmoid colon.      The retroflexed view of the distal rectum and anal verge was normal and       showed no anal or rectal abnormalities. Impression:               - Diverticulosis in the sigmoid colon.                           - The distal rectum and anal verge are normal on                            retroflexion view.                           - No specimens collected. Moderate Sedation:      Per Anesthesia Care Recommendation:           - Discharge patient to home (ambulatory).                           - Resume previous diet.                           - Repeat colonoscopy in 10 years for screening                            purposes.                           - Continue Miralax daily.                           -  Start Elavil 10 mg every night if symptoms                            persist a week after colonoscopy. Procedure Code(s):        --- Professional ---                           [id number], Colonoscopy, flexible; diagnostic, including                            collection of specimen(s) by brushing or washing,                            when performed (separate procedure) Diagnosis Code(s):        --- Professional ---                           K62.89, Other specified diseases of anus and rectum                           K57.30, Diverticulosis of large intestine without                            perforation or abscess without bleeding CPT copyright 2019 [employer]. All rights  reserved. The codes documented in this report are preliminary and upon coder review may  be revised to meet current compliance requirements. Katrinka Blazing, MD Katrinka Blazing,  03/22/2021 12:30:45 PM This report has been signed electronically. Number of Addenda: 0

## 2021-03-26 ENCOUNTER — Encounter (INDEPENDENT_AMBULATORY_CARE_PROVIDER_SITE_OTHER): Payer: Self-pay | Admitting: *Deleted

## 2021-03-26 ENCOUNTER — Encounter (HOSPITAL_COMMUNITY): Payer: Self-pay | Admitting: Gastroenterology

## 2021-06-03 HISTORY — PX: BREAST CYST ASPIRATION: SHX578

## 2021-06-06 ENCOUNTER — Other Ambulatory Visit (HOSPITAL_COMMUNITY): Payer: Self-pay | Admitting: Emergency Medicine

## 2021-06-06 ENCOUNTER — Other Ambulatory Visit (HOSPITAL_COMMUNITY): Payer: Self-pay | Admitting: Family Medicine

## 2021-06-06 DIAGNOSIS — Z1231 Encounter for screening mammogram for malignant neoplasm of breast: Secondary | ICD-10-CM

## 2021-06-13 ENCOUNTER — Ambulatory Visit (HOSPITAL_COMMUNITY)
Admission: RE | Admit: 2021-06-13 | Discharge: 2021-06-13 | Disposition: A | Discharge status: Home / Self Care | Payer: Medicare Other | Source: Ambulatory Visit | Attending: Emergency Medicine | Admitting: Emergency Medicine

## 2021-06-13 DIAGNOSIS — Z1231 Encounter for screening mammogram for malignant neoplasm of breast: Secondary | ICD-10-CM | POA: Insufficient documentation

## 2021-06-14 ENCOUNTER — Other Ambulatory Visit (HOSPITAL_COMMUNITY): Payer: Self-pay | Admitting: Emergency Medicine

## 2021-06-17 ENCOUNTER — Other Ambulatory Visit (HOSPITAL_COMMUNITY): Payer: Self-pay | Admitting: Emergency Medicine

## 2021-06-19 ENCOUNTER — Other Ambulatory Visit (HOSPITAL_COMMUNITY): Payer: Self-pay | Admitting: Emergency Medicine

## 2021-06-19 DIAGNOSIS — R928 Other abnormal and inconclusive findings on diagnostic imaging of breast: Secondary | ICD-10-CM

## 2021-06-20 ENCOUNTER — Ambulatory Visit (HOSPITAL_COMMUNITY)
Admission: RE | Admit: 2021-06-20 | Discharge: 2021-06-20 | Disposition: A | Discharge status: Home / Self Care | Payer: Medicare Other | Source: Ambulatory Visit | Attending: Emergency Medicine | Admitting: Emergency Medicine

## 2021-06-20 ENCOUNTER — Other Ambulatory Visit (HOSPITAL_COMMUNITY): Payer: Self-pay | Admitting: Emergency Medicine

## 2021-06-20 DIAGNOSIS — R928 Other abnormal and inconclusive findings on diagnostic imaging of breast: Secondary | ICD-10-CM

## 2021-07-02 ENCOUNTER — Other Ambulatory Visit (HOSPITAL_COMMUNITY): Payer: Medicare Other

## 2021-07-02 ENCOUNTER — Other Ambulatory Visit (HOSPITAL_COMMUNITY): Payer: Self-pay | Admitting: Emergency Medicine

## 2021-07-02 ENCOUNTER — Ambulatory Visit (HOSPITAL_COMMUNITY)
Admission: RE | Admit: 2021-07-02 | Discharge: 2021-07-02 | Disposition: A | Discharge status: Home / Self Care | Payer: Medicare Other | Source: Ambulatory Visit | Attending: Emergency Medicine | Admitting: Emergency Medicine

## 2021-07-02 ENCOUNTER — Ambulatory Visit (HOSPITAL_COMMUNITY)
Admission: RE | Admit: 2021-07-02 | Discharge: 2021-07-02 | Disposition: A | Discharge status: Home / Self Care | Payer: Medicare Other | Source: Ambulatory Visit | Attending: Family Medicine | Admitting: Family Medicine

## 2021-07-02 ENCOUNTER — Other Ambulatory Visit (HOSPITAL_COMMUNITY): Payer: Self-pay | Admitting: Family Medicine

## 2021-07-02 DIAGNOSIS — R928 Other abnormal and inconclusive findings on diagnostic imaging of breast: Secondary | ICD-10-CM

## 2021-07-02 MED ORDER — LIDOCAINE HCL (PF) 2 % IJ SOLN
INTRAMUSCULAR | Status: AC
  Filled 2021-07-02: qty 10

## 2021-07-02 MED ORDER — LIDOCAINE-EPINEPHRINE (PF) 1 %-1:200000 IJ SOLN
INTRAMUSCULAR | Status: DC
Start: 2021-07-02 — End: 2021-07-02
  Filled 2021-07-02: qty 30

## 2021-07-03 LAB — SURGICAL PATHOLOGY

## 2021-07-05 ENCOUNTER — Other Ambulatory Visit: Payer: Self-pay | Admitting: Emergency Medicine

## 2021-07-05 ENCOUNTER — Other Ambulatory Visit (HOSPITAL_COMMUNITY): Payer: Self-pay | Admitting: Emergency Medicine

## 2021-07-05 DIAGNOSIS — N6320 Unspecified lump in the left breast, unspecified quadrant: Secondary | ICD-10-CM

## 2021-07-15 ENCOUNTER — Ambulatory Visit
Admission: RE | Admit: 2021-07-15 | Discharge: 2021-07-15 | Disposition: A | Discharge status: Home / Self Care | Payer: Medicare Other | Source: Ambulatory Visit | Attending: Emergency Medicine | Admitting: Emergency Medicine

## 2021-07-15 DIAGNOSIS — N6320 Unspecified lump in the left breast, unspecified quadrant: Secondary | ICD-10-CM

## 2021-07-26 ENCOUNTER — Ambulatory Visit: Payer: Self-pay | Admitting: Surgery

## 2021-07-26 DIAGNOSIS — N6092 Unspecified benign mammary dysplasia of left breast: Secondary | ICD-10-CM

## 2021-07-26 DIAGNOSIS — E7800 Pure hypercholesterolemia, unspecified: Secondary | ICD-10-CM | POA: Insufficient documentation

## 2021-07-26 DIAGNOSIS — G629 Polyneuropathy, unspecified: Secondary | ICD-10-CM | POA: Insufficient documentation

## 2021-07-26 DIAGNOSIS — I10 Essential (primary) hypertension: Secondary | ICD-10-CM | POA: Insufficient documentation

## 2021-07-26 DIAGNOSIS — J301 Allergic rhinitis due to pollen: Secondary | ICD-10-CM | POA: Insufficient documentation

## 2021-07-26 DIAGNOSIS — E119 Type 2 diabetes mellitus without complications: Secondary | ICD-10-CM | POA: Insufficient documentation

## 2021-07-26 NOTE — H&P (View-Only) (Signed)
Subjective   Chief Complaint: New Patient (Left breast atypical lobular hyperplasia )     History of Present Illness: Christina Erickson is a 71 y.o. female who is seen today as an office consultation at the request of Dr. Bryna Colander for evaluation of New Patient (Left breast atypical lobular hyperplasia ) .   This is a 71 year old female with no family history or personal history of breast cancer who presents after recent mammogram revealed possible bilateral masses.  She was found to have a cyst in her right breast that was aspirated with complete resolution.  She had 2 biopsies performed on the left.  1 biopsy located at 12:00 in the retroareolar region measured 0.8 cm.  That revealed atypical lobular hyperplasia.  The other left biopsy revealed benign breast tissue with cystic papillary apocrine metaplasia.  That biopsy was felt to be concordant.  Review of Systems: A complete review of systems was obtained from the patient.  I have reviewed this information and discussed as appropriate with the patient.  See HPI as well for other ROS.  Review of Systems  Constitutional: Negative.   HENT: Negative.    Eyes:  Positive for pain.  Respiratory:  Positive for cough.   Cardiovascular: Negative.   Gastrointestinal: Negative.   Genitourinary: Negative.   Musculoskeletal: Negative.   Skin: Negative.   Neurological: Negative.   Endo/Heme/Allergies: Negative.   Psychiatric/Behavioral: Negative.       Medical History: Past Medical History:  Diagnosis Date   Arthritis    Diabetes mellitus type 2, uncomplicated (CMS-HCC)    borderline, diet controlled   Hyperlipidemia    Hypertension    Osteoporosis    Peripheral neuropathy    Sciatica    Seasonal allergic rhinitis due to pollen     Patient Active Problem List  Diagnosis   Allergic rhinitis due to pollen   Benign essential hypertension   Constipation   High cholesterol   Peripheral neuropathy   Type 2 diabetes mellitus (CMS-HCC)     Past Surgical History:  Procedure Laterality Date   COLONOSCOPY  03/28/2003   Normal Colon   right foot Right 2015   COLONOSCOPY  07/22/2013   Hyperplastic Polyps: CBF 07/2023   CESAREAN SECTION     TUBAL LIGATION       Allergies  Allergen Reactions   Statins-Hmg-Coa Reductase Inhibitors Other (See Comments) and Hives    "Pins and needles"   Naproxen Nausea, Vomiting and Unknown    Current Outpatient Medications on File Prior to Visit  Medication Sig Dispense Refill   amitriptyline (ELAVIL) 10 MG tablet Take 1 tablet by mouth at bedtime     amLODIPine (NORVASC) 10 MG tablet Take 10 mg by mouth once daily.     amoxicillin-clavulanate (AUGMENTIN) 875-125 mg tablet 1 tablet     aspirin 81 MG EC tablet Take 81 mg by mouth once daily.     benazepril (LOTENSIN) 40 MG tablet Take 40 mg by mouth once daily.     benzonatate (TESSALON) 100 MG capsule 1 capsule as needed     calcium carbonate-vitamin D3 (CALTRATE 600+D) 600 mg(1,500mg ) -200 unit tablet Take 1 tablet by mouth 2 (two) times daily       cholecalciferol (VITAMIN D3) 2,000 unit capsule Take 2,000 Units by mouth once daily.     lovastatin (MEVACOR) 40 MG tablet Take 1 tablet by mouth once daily     No current facility-administered medications on file prior to visit.    Family History  Problem Relation Age of Onset   Diabetes type II Mother    High blood pressure (Hypertension) Mother    Hyperlipidemia (Elevated cholesterol) Mother    Diabetes Mother    Heart disease Mother    Heart disease Father    High blood pressure (Hypertension) Father    Diabetes type II Father    High blood pressure (Hypertension) Sister    Obesity Sister      Social History   Tobacco Use  Smoking Status Every Day   Packs/day: 0.25   Types: Cigarettes  Smokeless Tobacco Current     Social History   Socioeconomic History   Marital status: Single  Tobacco Use   Smoking status: Every Day    Packs/day: 0.25    Types: Cigarettes    Smokeless tobacco: Current  Vaping Use   Vaping Use: Never used  Substance and Sexual Activity   Alcohol use: Never    Comment: occasionally   Drug use: Never    Objective:    Vitals:   07/26/21 0942  BP: 122/78  Pulse: 86  Temp: 36.7 C (98 F)  SpO2: 98%  Weight: 79 kg (174 lb 3.2 oz)  Height: 157.5 cm (5\' 2" )    Body mass index is 31.86 kg/m.  Physical Exam   Constitutional:  WDWN in NAD, conversant, no obvious deformities; lying in bed comfortably Eyes:  Pupils equal, round; sclera anicteric; moist conjunctiva; no lid lag HENT:  Oral mucosa moist; good dentition  Neck:  No masses palpated, trachea midline; no thyromegaly Lungs:  CTA bilaterally; normal respiratory effort Breasts:  symmetric, no nipple changes; no palpable masses or lymphadenopathy on either side CV:  Regular rate and rhythm; no murmurs; extremities well-perfused with no edema Abd:  +bowel sounds, soft, non-tender, no palpable organomegaly; no palpable hernias Musc:  Unable to assess gait; no apparent clubbing or cyanosis in extremities Lymphatic:  No palpable cervical or axillary lymphadenopathy Skin:  Warm, dry; no sign of jaundice Psychiatric - alert and oriented x 4; calm mood and affect   Labs, Imaging and Diagnostic Testing: SURGICAL PATHOLOGY  CASE: [phone]  PATIENT: Christina Erickson  Surgical Pathology Report      Clinical History: ? dilated duct/FCC, r/o malignancy      FINAL MICROSCOPIC DIAGNOSIS:   A. BREAST, 12:00, MASS, LEFT, BIOPSY:  - Atypical lobular hyperplasia, see comment    CLINICAL DATA:  71 year old female for further evaluation of possible bilateral breast masses on screening mammogram.   EXAM: DIGITAL DIAGNOSTIC BILATERAL MAMMOGRAM WITH TOMOSYNTHESIS AND CAD; ULTRASOUND RIGHT BREAST LIMITED; ULTRASOUND LEFT BREAST LIMITED   TECHNIQUE: Bilateral digital diagnostic mammography and breast tomosynthesis was performed. The images were evaluated with  computer-aided detection.; Targeted ultrasound examination of the right breast was performed; Targeted ultrasound examination of the left breast was performed.   COMPARISON:  Previous exam(s).   ACR Breast Density Category b: There are scattered areas of fibroglandular density.   FINDINGS: Spot compression views of both breast demonstrate a persistent circumscribed oval mass within the middle depth INNER RIGHT breast and a persistent circumscribed oval low-density mass within the posterior RETROAREOLAR/UPPER LEFT breast.   Targeted ultrasound is performed, showing the following:   RIGHT breast:   A 0.3 x 0.3 x 0.6 cm hypoechoic mass containing probable microcysts at the 3 o'clock position 6 cm from the nipple. No abnormal RIGHT axillary lymph nodes are noted.   LEFT breast:   A 0.4 x 0.4 x 0.8 cm hypoechoic mass with indistinct and  a few possible angular margins at the 12 o'clock position of the RETROAREOLAR LEFT breast.   No other sonographic abnormalities are identified within the RETROAREOLAR or UPPER LEFT breast.   No abnormal appearing lymph nodes are noted within either axillary region.   IMPRESSION: 1. Indeterminate 0.8 cm UPPER RETROAREOLAR LEFT breast mass identified sonographically which is equivocal to represent the mammographic mass. Tissue sampling is recommended of this sonographic mass with correlation to the mammographic finding on post biopsy clip films. 2. Likely benign 0.6 cm INNER RIGHT breast mass. Aspiration/tissue sampling and short-term follow-up were presented to the patient. Given that the LEFT breast finding will need to be sampled, she desires aspiration/tissue sampling of this INNER RIGHT breast mass.   RECOMMENDATION: 1. Ultrasound-guided biopsy of UPPER RETROAREOLAR LEFT breast mass. 2. Ultrasound-guided aspiration/biopsy of INNER RIGHT breast mass.   I have discussed the findings and recommendations with the patient. If applicable, a  reminder letter will be sent to the patient regarding the next appointment.   BI-RADS CATEGORY  4: Suspicious.     Electronically Signed   By: Harmon Pier M.D.   On: 06/20/2021 14:08  Assessment and Plan:  Diagnoses and all orders for this visit:  Atypical lobular hyperplasia of left breast     Recommend left breast radioactive seed localized lumpectomy.The surgical procedure has been discussed with the patient.  Potential risks, benefits, alternative treatments, and expected outcomes have been explained.  All of the patient's questions at this time have been answered.  The likelihood of reaching the patient's treatment goal is good.  The patient understand the proposed surgical procedure and wishes to proceed.   Wilmon Arms. Corliss Skains, MD, [facility] Surgery  General Surgery   07/26/2021 10:10 AM

## 2021-07-30 ENCOUNTER — Other Ambulatory Visit: Payer: Self-pay | Admitting: Surgery

## 2021-07-30 DIAGNOSIS — N6092 Unspecified benign mammary dysplasia of left breast: Secondary | ICD-10-CM

## 2021-08-14 NOTE — Progress Notes (Signed)
Surgical Instructions    Your procedure is scheduled on Tuesday July 18th .  Report to [facility] Main Entrance "A" at 5:30 A.M., then check in with the Admitting office.  Call this number if you have problems the morning of surgery:  [phone]   If you have any questions prior to your surgery date call [phone]: Open Monday-Friday 8am-4pm    Remember:  Do not eat after midnight the night before your surgery  You may drink clear liquids until 4:30am the morning of your surgery.   Clear liquids allowed are: Water, Non-Citrus Juices (without pulp), Carbonated Beverages, Clear Tea, Black Coffee ONLY (NO MILK, CREAM OR POWDERED CREAMER of any kind), and Gatorade    Take these medicines the morning of surgery with A SIP OF WATER: amLODipine (NORVASC) 10 MG tablet fenofibrate (TRICOR) 48 MG tablet   As of today, STOP taking any Aspirin (unless otherwise instructed by your surgeon) Aleve, Naproxen, Ibuprofen, Motrin, Advil, Goody's, BC's, all herbal medications, fish oil, and all vitamins.           Do not wear jewelry or makeup Do not wear lotions, powders, perfumes, or deodorant. Do not shave 48 hours prior to surgery.   Do not bring valuables to the hospital. Do not wear nail polish, gel polish, artificial nails, or any other type of covering on natural nails (fingers and toes) If you have artificial nails or gel coating that need to be removed by a nail salon, please have this removed prior to surgery. Artificial nails or gel coating may interfere with anesthesia's ability to adequately monitor your vital signs.  [facility] is not responsible for any belongings or valuables. .   Do NOT Smoke (Tobacco/Vaping)  24 hours prior to your procedure  If you use a CPAP at night, you may bring your mask for your overnight stay.   Contacts, glasses, hearing aids, dentures or partials may not be worn into surgery, please bring cases for these belongings   For patients admitted to the  hospital, discharge time will be determined by your treatment team.   Patients discharged the day of surgery will not be allowed to drive home, and someone needs to stay with them for 24 hours.   SURGICAL WAITING ROOM VISITATION Patients having surgery or a procedure in a hospital may have two support people. Children under the age of 96 must have an adult with them who is not the patient. They may stay in the waiting area during the procedure and may switch out with other visitors. If the patient needs to stay at the hospital during part of their recovery, the visitor guidelines for inpatient rooms apply.  Please refer to the [facility] website for the visitor guidelines for Inpatients (after your surgery is over and you are in a regular room).       Special instructions:    Oral Hygiene is also important to reduce your risk of infection.  Remember - BRUSH YOUR TEETH THE MORNING OF SURGERY WITH YOUR REGULAR TOOTHPASTE   [place]- Preparing For Surgery  Before surgery, you can play an important role. Because skin is not sterile, your skin needs to be as free of germs as possible. You can reduce the number of germs on your skin by washing with CHG (chlorahexidine gluconate) Soap before surgery.  CHG is an antiseptic cleaner which kills germs and bonds with the skin to continue killing germs even after washing.     Please do not use if  you have an allergy to CHG or antibacterial soaps. If your skin becomes reddened/irritated stop using the CHG.  Do not shave (including legs and underarms) for at least 48 hours prior to first CHG shower. It is OK to shave your face.  Please follow these instructions carefully.     Shower the NIGHT BEFORE SURGERY and the MORNING OF SURGERY with CHG Soap.   If you chose to wash your hair, wash your hair first as usual with your normal shampoo. After you shampoo, rinse your hair and body thoroughly to remove the shampoo.  Then [employer] and genitals  (private parts) with your normal soap and rinse thoroughly to remove soap.  After that Use CHG Soap as you would any other liquid soap. You can apply CHG directly to the skin and wash gently with a scrungie or a clean washcloth.   Apply the CHG Soap to your body ONLY FROM THE NECK DOWN.  Do not use on open wounds or open sores. Avoid contact with your eyes, ears, mouth and genitals (private parts). Wash Face and genitals (private parts)  with your normal soap.   Wash thoroughly, paying special attention to the area where your surgery will be performed.  Thoroughly rinse your body with warm water from the neck down.  DO NOT shower/wash with your normal soap after using and rinsing off the CHG Soap.  Pat yourself dry with a CLEAN TOWEL.  Wear CLEAN PAJAMAS to bed the night before surgery  Place CLEAN SHEETS on your bed the night before your surgery  DO NOT SLEEP WITH PETS.   Day of Surgery:  Take a shower with CHG soap. Wear Clean/Comfortable clothing the morning of surgery Do not apply any deodorants/lotions.   Remember to brush your teeth WITH YOUR REGULAR TOOTHPASTE.    If you received a COVID test during your pre-op visit, it is requested that you wear a mask when out in public, stay away from anyone that may not be feeling well, and notify your surgeon if you develop symptoms. If you have been in contact with anyone that has tested positive in the last 10 days, please notify your surgeon.    Please read over the following fact sheets that you were given.

## 2021-08-15 ENCOUNTER — Inpatient Hospital Stay (HOSPITAL_COMMUNITY)
Admission: RE | Admit: 2021-08-15 | Discharge: 2021-08-15 | Disposition: A | Discharge status: Home / Self Care | Payer: Medicare Other | Source: Ambulatory Visit

## 2021-08-15 NOTE — Progress Notes (Signed)
1010  Attempted to reach patient to make sure she was coming to her 10am appt. Left a message.

## 2021-08-16 ENCOUNTER — Other Ambulatory Visit: Payer: Self-pay

## 2021-08-16 ENCOUNTER — Encounter (HOSPITAL_COMMUNITY): Payer: Self-pay | Admitting: Surgery

## 2021-08-16 NOTE — Progress Notes (Signed)
Christina Erickson denies chest pain or shortness of breath. Patient denies having any s/s of Covid in her household.  Patient denies any known exposure to Covid.   Christina Erickson's PCP is Christina Erickson at [facility].   I instructed Christina Erickson to hold vitamins and Fish Oil, starting today.  Christina Erickson has Pre- diabetes, she does not check CBG.

## 2021-08-19 ENCOUNTER — Ambulatory Visit
Admission: RE | Admit: 2021-08-19 | Discharge: 2021-08-19 | Disposition: A | Discharge status: Home / Self Care | Payer: Medicare Other | Source: Ambulatory Visit | Attending: Surgery | Admitting: Surgery

## 2021-08-19 DIAGNOSIS — N6092 Unspecified benign mammary dysplasia of left breast: Secondary | ICD-10-CM

## 2021-08-20 ENCOUNTER — Encounter (HOSPITAL_COMMUNITY)
Admission: RE | Disposition: A | Discharge status: Home / Self Care | Payer: Self-pay | Source: Home / Self Care | Attending: Surgery

## 2021-08-20 ENCOUNTER — Ambulatory Visit
Admission: RE | Admit: 2021-08-20 | Discharge: 2021-08-20 | Disposition: A | Discharge status: Home / Self Care | Payer: Medicare Other | Source: Ambulatory Visit | Attending: Surgery | Admitting: Surgery

## 2021-08-20 ENCOUNTER — Ambulatory Visit (HOSPITAL_COMMUNITY)
Admission: RE | Admit: 2021-08-20 | Discharge: 2021-08-20 | Disposition: A | Discharge status: Home / Self Care | Payer: Medicare Other | Attending: Surgery | Admitting: Surgery

## 2021-08-20 ENCOUNTER — Ambulatory Visit (HOSPITAL_BASED_OUTPATIENT_CLINIC_OR_DEPARTMENT_OTHER): Payer: Medicare Other | Admitting: Anesthesiology

## 2021-08-20 ENCOUNTER — Ambulatory Visit (HOSPITAL_COMMUNITY): Payer: Medicare Other | Admitting: Anesthesiology

## 2021-08-20 DIAGNOSIS — R7303 Prediabetes: Secondary | ICD-10-CM | POA: Insufficient documentation

## 2021-08-20 DIAGNOSIS — F17210 Nicotine dependence, cigarettes, uncomplicated: Secondary | ICD-10-CM | POA: Diagnosis not present

## 2021-08-20 DIAGNOSIS — I10 Essential (primary) hypertension: Secondary | ICD-10-CM | POA: Diagnosis not present

## 2021-08-20 DIAGNOSIS — N6092 Unspecified benign mammary dysplasia of left breast: Secondary | ICD-10-CM | POA: Diagnosis present

## 2021-08-20 DIAGNOSIS — N6082 Other benign mammary dysplasias of left breast: Secondary | ICD-10-CM | POA: Insufficient documentation

## 2021-08-20 HISTORY — PX: BREAST LUMPECTOMY WITH RADIOACTIVE SEED LOCALIZATION: SHX6424

## 2021-08-20 HISTORY — DX: Prediabetes: R73.03

## 2021-08-20 HISTORY — PX: BREAST EXCISIONAL BIOPSY: SUR124

## 2021-08-20 LAB — BASIC METABOLIC PANEL
Anion gap: 10 (ref 5–15)
BUN: 18 mg/dL (ref 8–23)
CO2: 24 mmol/L (ref 22–32)
Calcium: 9.4 mg/dL (ref 8.9–10.3)
Chloride: 108 mmol/L (ref 98–111)
Creatinine, Ser: 0.93 mg/dL (ref 0.44–1.00)
GFR, Estimated: >60 mL/min (ref >60)
Glucose, Bld: 135 mg/dL — ABNORMAL HIGH (ref 70–99)
Potassium: 3.9 mmol/L (ref 3.5–5.1)
Sodium: 142 mmol/L (ref 135–145)

## 2021-08-20 LAB — CBC
HCT: 38.5 % (ref 36.0–46.0)
Hemoglobin: 12.2 g/dL (ref 12.0–15.0)
MCH: 29.5 pg (ref 26.0–34.0)
MCHC: 31.7 g/dL (ref 30.0–36.0)
MCV: 93.2 fL (ref 80.0–100.0)
Platelets: 260 10*3/uL (ref 150–400)
RBC: 4.13 MIL/uL (ref 3.87–5.11)
RDW: 14.3 % (ref 11.5–15.5)
WBC: 6.4 10*3/uL (ref 4.0–10.5)
nRBC: 0 % (ref 0.0–0.2)

## 2021-08-20 SURGERY — BREAST LUMPECTOMY WITH RADIOACTIVE SEED LOCALIZATION
Anesthesia: General | Site: Breast | Laterality: Left

## 2021-08-20 MED ORDER — EPHEDRINE SULFATE-NACL 50-0.9 MG/10ML-% IV SOSY
PREFILLED_SYRINGE | INTRAVENOUS | Status: DC | PRN
  Administered 2021-08-20: 5 mg via INTRAVENOUS

## 2021-08-20 MED ORDER — BUPIVACAINE-EPINEPHRINE 0.25% -1:200000 IJ SOLN
INTRAMUSCULAR | Status: DC | PRN
  Administered 2021-08-20: 10 mL

## 2021-08-20 MED ORDER — PROPOFOL 10 MG/ML IV BOLUS
INTRAVENOUS | Status: AC
  Filled 2021-08-20: qty 20

## 2021-08-20 MED ORDER — ONDANSETRON HCL 4 MG/2ML IJ SOLN: 4.0000 mg | Freq: Once | INTRAMUSCULAR | Status: DC | PRN

## 2021-08-20 MED ORDER — CHLORHEXIDINE GLUCONATE 0.12 % MT SOLN
OROMUCOSAL | Status: AC
  Administered 2021-08-20: 15 mL via OROMUCOSAL
  Filled 2021-08-20: qty 15

## 2021-08-20 MED ORDER — PROPOFOL 10 MG/ML IV BOLUS
INTRAVENOUS | Status: DC | PRN
  Administered 2021-08-20: 130 mg via INTRAVENOUS

## 2021-08-20 MED ORDER — AMISULPRIDE (ANTIEMETIC) 5 MG/2ML IV SOLN: 10.0000 mg | Freq: Once | INTRAVENOUS | Status: DC | PRN

## 2021-08-20 MED ORDER — 0.9 % SODIUM CHLORIDE (POUR BTL) OPTIME
TOPICAL | Status: DC | PRN
  Administered 2021-08-20: 1000 mL

## 2021-08-20 MED ORDER — LIDOCAINE 2% (20 MG/ML) 5 ML SYRINGE
INTRAMUSCULAR | Status: AC
  Filled 2021-08-20: qty 5

## 2021-08-20 MED ORDER — CEFAZOLIN SODIUM-DEXTROSE 2-4 GM/100ML-% IV SOLN
INTRAVENOUS | Status: AC
  Filled 2021-08-20: qty 100

## 2021-08-20 MED ORDER — CHLORHEXIDINE GLUCONATE 0.12 % MT SOLN: 15.0000 mL | Freq: Once | OROMUCOSAL | Status: AC

## 2021-08-20 MED ORDER — ACETAMINOPHEN 500 MG PO TABS
ORAL_TABLET | ORAL | Status: AC
  Administered 2021-08-20: 1000 mg via ORAL
  Filled 2021-08-20: qty 2

## 2021-08-20 MED ORDER — FENTANYL CITRATE (PF) 250 MCG/5ML IJ SOLN
INTRAMUSCULAR | Status: DC | PRN
  Administered 2021-08-20: 50 ug via INTRAVENOUS

## 2021-08-20 MED ORDER — EPHEDRINE 5 MG/ML INJ
INTRAVENOUS | Status: AC
  Filled 2021-08-20: qty 5

## 2021-08-20 MED ORDER — ACETAMINOPHEN 500 MG PO TABS: 1000.0000 mg | ORAL_TABLET | Freq: Once | ORAL | Status: DC

## 2021-08-20 MED ORDER — FENTANYL CITRATE (PF) 250 MCG/5ML IJ SOLN
INTRAMUSCULAR | Status: AC
  Filled 2021-08-20: qty 5

## 2021-08-20 MED ORDER — ACETAMINOPHEN 500 MG PO TABS: 1000.0000 mg | ORAL_TABLET | ORAL | Status: AC

## 2021-08-20 MED ORDER — HYDROMORPHONE HCL 1 MG/ML IJ SOLN: 0.2500 mg | INTRAMUSCULAR | Status: DC | PRN

## 2021-08-20 MED ORDER — CEFAZOLIN SODIUM-DEXTROSE 2-4 GM/100ML-% IV SOLN
2.0000 g | INTRAVENOUS | Status: AC
  Administered 2021-08-20: 2 g via INTRAVENOUS

## 2021-08-20 MED ORDER — OXYCODONE HCL 5 MG/5ML PO SOLN: 5.0000 mg | Freq: Once | ORAL | Status: DC | PRN

## 2021-08-20 MED ORDER — OXYCODONE HCL 5 MG PO TABS: 5.0000 mg | ORAL_TABLET | Freq: Once | ORAL | Status: DC | PRN

## 2021-08-20 MED ORDER — BUPIVACAINE-EPINEPHRINE (PF) 0.25% -1:200000 IJ SOLN
INTRAMUSCULAR | Status: AC
  Filled 2021-08-20: qty 30

## 2021-08-20 MED ORDER — ONDANSETRON HCL 4 MG/2ML IJ SOLN
INTRAMUSCULAR | Status: AC
Start: 2021-08-20 — End: ?
  Filled 2021-08-20: qty 2

## 2021-08-20 MED ORDER — LACTATED RINGERS IV SOLN: INTRAVENOUS | Status: DC

## 2021-08-20 MED ORDER — CHLORHEXIDINE GLUCONATE CLOTH 2 % EX PADS: 6.0000 | MEDICATED_PAD | Freq: Once | CUTANEOUS | Status: DC

## 2021-08-20 MED ORDER — DEXAMETHASONE SODIUM PHOSPHATE 10 MG/ML IJ SOLN
INTRAMUSCULAR | Status: DC | PRN
  Administered 2021-08-20: 5 mg via INTRAVENOUS

## 2021-08-20 MED ORDER — ORAL CARE MOUTH RINSE: 15.0000 mL | Freq: Once | OROMUCOSAL | Status: AC

## 2021-08-20 MED ORDER — ONDANSETRON HCL 4 MG/2ML IJ SOLN
INTRAMUSCULAR | Status: DC | PRN
  Administered 2021-08-20: 4 mg via INTRAVENOUS

## 2021-08-20 MED ORDER — LIDOCAINE 2% (20 MG/ML) 5 ML SYRINGE
INTRAMUSCULAR | Status: DC | PRN
  Administered 2021-08-20: 60 mg via INTRAVENOUS

## 2021-08-20 MED ORDER — DEXAMETHASONE SODIUM PHOSPHATE 10 MG/ML IJ SOLN
INTRAMUSCULAR | Status: AC
  Filled 2021-08-20: qty 1

## 2021-08-20 SURGICAL SUPPLY — 41 items
APPLIER CLIP 9.375 MED OPEN (MISCELLANEOUS)
BAG COUNTER SPONGE SURGICOUNT (BAG) ×2 IMPLANT
BENZOIN TINCTURE PRP APPL 2/3 (GAUZE/BANDAGES/DRESSINGS) ×2 IMPLANT
BINDER BREAST LRG (GAUZE/BANDAGES/DRESSINGS) IMPLANT
BINDER BREAST XLRG (GAUZE/BANDAGES/DRESSINGS) IMPLANT
CANISTER SUCT 3000ML PPV (MISCELLANEOUS) ×2 IMPLANT
CHLORAPREP W/TINT 26 (MISCELLANEOUS) ×2 IMPLANT
CLIP APPLIE 9.375 MED OPEN (MISCELLANEOUS) IMPLANT
COVER PROBE W GEL 5X96 (DRAPES) ×2 IMPLANT
COVER SURGICAL LIGHT HANDLE (MISCELLANEOUS) ×2 IMPLANT
DEVICE DUBIN SPECIMEN MAMMOGRA (MISCELLANEOUS) ×2 IMPLANT
DRAPE CHEST BREAST 15X10 FENES (DRAPES) ×2 IMPLANT
DRSG TEGADERM 4X4.75 (GAUZE/BANDAGES/DRESSINGS) ×2 IMPLANT
ELECT CAUTERY BLADE 6.4 (BLADE) ×2 IMPLANT
ELECT REM PT RETURN 9FT ADLT (ELECTROSURGICAL) ×2
ELECTRODE REM PT RTRN 9FT ADLT (ELECTROSURGICAL) ×1 IMPLANT
GAUZE SPONGE 2X2 8PLY STRL LF (GAUZE/BANDAGES/DRESSINGS) ×1 IMPLANT
GLOVE BIO SURGEON STRL SZ7 (GLOVE) ×2 IMPLANT
GLOVE BIOGEL PI IND STRL 7.5 (GLOVE) ×1 IMPLANT
GLOVE BIOGEL PI INDICATOR 7.5 (GLOVE) ×1
GOWN STRL REUS W/ TWL LRG LVL3 (GOWN DISPOSABLE) ×2 IMPLANT
GOWN STRL REUS W/TWL LRG LVL3 (GOWN DISPOSABLE) ×2
ILLUMINATOR WAVEGUIDE N/F (MISCELLANEOUS) IMPLANT
KIT BASIN OR (CUSTOM PROCEDURE TRAY) ×2 IMPLANT
KIT MARKER MARGIN INK (KITS) ×2 IMPLANT
LIGHT WAVEGUIDE WIDE FLAT (MISCELLANEOUS) IMPLANT
NDL HYPO 25GX1X1/2 BEV (NEEDLE) ×1 IMPLANT
NEEDLE HYPO 25GX1X1/2 BEV (NEEDLE) ×2 IMPLANT
NS IRRIG 1000ML POUR BTL (IV SOLUTION) ×2 IMPLANT
PACK GENERAL/GYN (CUSTOM PROCEDURE TRAY) ×2 IMPLANT
SPONGE GAUZE 2X2 STER 10/PKG (GAUZE/BANDAGES/DRESSINGS) ×1
SPONGE T-LAP 4X18 ~~LOC~~+RFID (SPONGE) ×2 IMPLANT
STRIP CLOSURE SKIN 1/2X4 (GAUZE/BANDAGES/DRESSINGS) ×2 IMPLANT
STRIP CLOSURE SKIN 1/4X4 (GAUZE/BANDAGES/DRESSINGS) ×1 IMPLANT
SUT MNCRL AB 4-0 PS2 18 (SUTURE) ×2 IMPLANT
SUT SILK 2 0 SH (SUTURE) IMPLANT
SUT VIC AB 3-0 SH 27 (SUTURE) ×1
SUT VIC AB 3-0 SH 27X BRD (SUTURE) ×1 IMPLANT
SYR CONTROL 10ML LL (SYRINGE) ×2 IMPLANT
TOWEL GREEN STERILE (TOWEL DISPOSABLE) ×2 IMPLANT
TOWEL GREEN STERILE FF (TOWEL DISPOSABLE) ×2 IMPLANT

## 2021-08-20 NOTE — Interval H&P Note (Signed)
History and Physical Interval Note:  08/20/2021 6:39 AM  Christina Erickson  has presented today for surgery, with the diagnosis of LEFT BREAST ATYPICAL LOBULAR HYPERPLASIA.  The various methods of treatment have been discussed with the patient and family. After consideration of risks, benefits and other options for treatment, the patient has consented to  Procedure(s) with comments: LEFT BREAST RADIOACTIVE SEED LOCALIZED LUMPECTOMY (Left) - LMA as a surgical intervention.  The patient's history has been reviewed, patient examined, no change in status, stable for surgery.  I have reviewed the patient's chart and labs.  Questions were answered to the patient's satisfaction.     Wynona Luna

## 2021-08-20 NOTE — Anesthesia Preprocedure Evaluation (Addendum)
Anesthesia Evaluation  Patient identified by MRN, date of birth, ID band Patient awake    Reviewed: Allergy & Precautions, NPO status , Patient's Chart, lab work & pertinent test results  History of Anesthesia Complications Negative for: history of anesthetic complications  Airway Mallampati: II  TM Distance: >3 FB Neck ROM: Full    Dental  (+) Partial Upper,    Pulmonary Current Smoker,  4-5cigg/d Rescue inhaler once daily   Pulmonary exam normal breath sounds clear to auscultation       Cardiovascular hypertension (127/57 in preop), Pt. on medications Normal cardiovascular exam Rhythm:Regular Rate:Normal     Neuro/Psych negative neurological ROS  negative psych ROS   GI/Hepatic negative GI ROS, Neg liver ROS,   Endo/Other  diabetes (prediabetic)  Renal/GU negative Renal ROS  negative genitourinary   Musculoskeletal  (+) Arthritis , Osteoarthritis,    Abdominal   Peds  Hematology negative hematology ROS (+) Hb 12.2   Anesthesia Other Findings LEFT BREAST ATYPICAL LOBULAR HYPERPLASIA  Reproductive/Obstetrics negative OB ROS                           Anesthesia Physical Anesthesia Plan  ASA: 2  Anesthesia Plan: General   Post-op Pain Management: Tylenol PO (pre-op)*   Induction:   PONV Risk Score and Plan: Treatment may vary due to age or medical condition, Ondansetron and Dexamethasone  Airway Management Planned: LMA  Additional Equipment: None  Intra-op Plan:   Post-operative Plan: Extubation in OR  Informed Consent: I have reviewed the patients History and Physical, chart, labs and discussed the procedure including the risks, benefits and alternatives for the proposed anesthesia with the patient or authorized representative who has indicated his/her understanding and acceptance.     Dental advisory given  Plan Discussed with: CRNA  Anesthesia Plan Comments:         Anesthesia Quick Evaluation

## 2021-08-20 NOTE — Discharge Instructions (Signed)
[facility] Office Phone Number [phone]  BREAST BIOPSY/ PARTIAL MASTECTOMY: POST OP INSTRUCTIONS  Always review your discharge instruction sheet given to you by the facility where your surgery was performed.  IF YOU HAVE DISABILITY OR FAMILY LEAVE FORMS, YOU MUST BRING THEM TO THE OFFICE FOR PROCESSING.  DO NOT GIVE THEM TO YOUR DOCTOR.  A prescription for pain medication may be given to you upon discharge.  Take your pain medication as prescribed, if needed.  If narcotic pain medicine is not needed, then you may take acetaminophen (Tylenol) or ibuprofen (Advil) as needed. Take your usually prescribed medications unless otherwise directed If you need a refill on your pain medication, please contact your pharmacy.  They will contact our office to request authorization.  Prescriptions will not be filled after 5pm or on week-ends. You should eat very light the first 24 hours after surgery, such as soup, crackers, pudding, etc.  Resume your normal diet the day after surgery. Most patients will experience some swelling and bruising in the breast.  Ice packs and a good support bra will help.  Swelling and bruising can take several days to resolve.  It is common to experience some constipation if taking pain medication after surgery.  Increasing fluid intake and taking a stool softener will usually help or prevent this problem from occurring.  A mild laxative (Milk of Magnesia or Miralax) should be taken according to package directions if there are no bowel movements after 48 hours. Unless discharge instructions indicate otherwise, you may remove your bandages 24-48 hours after surgery, and you may shower at that time.  You may have steri-strips (small skin tapes) in place directly over the incision.  These strips should be left on the skin for 7-10 days.  If your surgeon used skin glue on the incision, you may shower in 24 hours.  The glue will flake off over the next 2-3 weeks.  Any  sutures or staples will be removed at the office during your follow-up visit. ACTIVITIES:  You may resume regular daily activities (gradually increasing) beginning the next day.  Wearing a good support bra or sports bra minimizes pain and swelling.  You may have sexual intercourse when it is comfortable. You may drive when you no longer are taking prescription pain medication, you can comfortably wear a seatbelt, and you can safely maneuver your car and apply brakes. RETURN TO WORK:  ______________________________________________________________________________________ You should see your doctor in the office for a follow-up appointment approximately two weeks after your surgery.  Your doctor's nurse will typically make your follow-up appointment when she calls you with your pathology report.  Expect your pathology report 2-3 business days after your surgery.  You may call to check if you do not hear from us after three days. OTHER INSTRUCTIONS: _______________________________________________________________________________________________ _____________________________________________________________________________________________________________________________________ _____________________________________________________________________________________________________________________________________ _____________________________________________________________________________________________________________________________________  WHEN TO CALL YOUR DOCTOR: Fever over 101.0 Nausea and/or vomiting. Extreme swelling or bruising. Continued bleeding from incision. Increased pain, redness, or drainage from the incision.  The clinic staff is available to answer your questions during regular business hours.  Please don't hesitate to call and ask to speak to one of the nurses for clinical concerns.  If you have a medical emergency, go to the nearest emergency room or call 911.  A surgeon from [facility] is always on call at the hospital.  For further questions, please visit [email or web address]   

## 2021-08-20 NOTE — Anesthesia Procedure Notes (Signed)
Procedure Name: LMA Insertion Date/Time: 08/20/2021 7:39 AM  Performed by: Laruth Bouchard., CRNAPre-anesthesia Checklist: Patient identified, Emergency Drugs available, Patient being monitored, Timeout performed and Suction available Patient Re-evaluated:Patient Re-evaluated prior to induction Oxygen Delivery Method: Circle system utilized Preoxygenation: Pre-oxygenation with 100% oxygen Induction Type: IV induction LMA: LMA inserted LMA Size: 4.0 Placement Confirmation: positive ETCO2 Tube secured with: Tape Dental Injury: Teeth and Oropharynx as per pre-operative assessment

## 2021-08-20 NOTE — Anesthesia Postprocedure Evaluation (Signed)
Anesthesia Post Note  Patient: Christina Erickson  Procedure(s) Performed: LEFT BREAST RADIOACTIVE SEED LOCALIZED LUMPECTOMY (Left: Breast)     Patient location during evaluation: PACU Anesthesia Type: General Level of consciousness: awake and alert, oriented and patient cooperative Pain management: pain level controlled Vital Signs Assessment: post-procedure vital signs reviewed and stable Respiratory status: spontaneous breathing, nonlabored ventilation and respiratory function stable Cardiovascular status: blood pressure returned to baseline and stable Postop Assessment: no apparent nausea or vomiting Anesthetic complications: no   No notable events documented.  Last Vitals:  Vitals:   08/20/21 0830 08/20/21 0845  BP: (!) 124/59 118/67  Pulse: (!) 57 (!) 57  Resp: 11 11  Temp: 36.4 C 36.4 C  SpO2: 96% 94%    Last Pain:  Vitals:   08/20/21 0845  TempSrc:   PainSc: 0-No pain                 Lannie Fields

## 2021-08-20 NOTE — Transfer of Care (Signed)
Immediate Anesthesia Transfer of Care Note  Patient: Christina Erickson  Procedure(s) Performed: LEFT BREAST RADIOACTIVE SEED LOCALIZED LUMPECTOMY (Left: Breast)  Patient Location: PACU  Anesthesia Type:General  Level of Consciousness: awake, alert  and oriented  Airway & Oxygen Therapy: Patient Spontanous Breathing  Post-op Assessment: Report given to RN and Post -op Vital signs reviewed and stable  Post vital signs: Reviewed and stable  Last Vitals:  Vitals Value Taken Time  BP 124/59 08/20/21 0831  Temp 36.4 C 08/20/21 0830  Pulse 55 08/20/21 0837  Resp 10 08/20/21 0837  SpO2 97 % 08/20/21 0837  Vitals shown include unvalidated device data.  Last Pain:  Vitals:   08/20/21 0830  TempSrc:   PainSc: 0-No pain         Complications: No notable events documented.

## 2021-08-20 NOTE — Op Note (Signed)
Pre-op Diagnosis:  Left breast atypical lobular hyperplasia Post-op Diagnosis: same Procedure:  Left radioactive seed localized lumpectomy Surgeon:  Laurent Cargile K. Resident:  Dr. Clerance Lav I was personally present during the key and critical portions of this procedure and immediately available throughout the entire procedure, as documented in my operative note.  Anesthesia:  GEN - LMA Indications:  .   This is a 71 year old female with no family history or personal history of breast cancer who presents after recent mammogram revealed possible bilateral masses.  She was found to have a cyst in her right breast that was aspirated with complete resolution.  She had 2 biopsies performed on the left.  1 biopsy located at 12:00 in the retroareolar region measured 0.8 cm.  That revealed atypical lobular hyperplasia.  The other left biopsy revealed benign breast tissue with cystic papillary apocrine metaplasia.  That biopsy was felt to be concordant. Description of procedure: The patient is brought to the operating room placed in supine position on the operating room table. After an adequate level of general anesthesia was obtained, her left breast was prepped with ChloraPrep and draped in sterile fashion. A timeout was taken to ensure the proper patient and proper procedure. We interrogated the breast with the neoprobe. The seed is located in the lateral retroareolar region.  We made a circumareolar incision around the side of the nipple after infiltrating with 0.25% Marcaine. Dissection was carried down in the breast tissue with cautery. We used the neoprobe to guide [place] towards the radioactive seed. We excised an area of tissue around the radioactive seed 1.5 cm in diameter. The specimen was removed and was oriented with a paint kit. Specimen mammogram showed the radioactive seed as well as the biopsy clip within the specimen. This was sent for pathologic examination. There is no residual  radioactivity within the biopsy cavity. We inspected carefully for hemostasis. The wound was thoroughly irrigated. The wound was closed with a deep layer of 3-0 Vicryl and a subcuticular layer of 4-0 Monocryl. Benzoin Steri-Strips were applied. The patient was then extubated and brought to the recovery room in stable condition. All sponge, instrument, and needle counts are correct.  Imogene Burn. Georgette Dover, MD, [facility] Surgery  General/ Trauma Surgery  08/20/2021 8:26 AM

## 2021-08-21 ENCOUNTER — Encounter (HOSPITAL_COMMUNITY): Payer: Self-pay | Admitting: Surgery

## 2021-08-21 LAB — SURGICAL PATHOLOGY

## 2021-09-04 ENCOUNTER — Encounter (HOSPITAL_COMMUNITY): Payer: Self-pay

## 2021-09-18 ENCOUNTER — Telehealth (INDEPENDENT_AMBULATORY_CARE_PROVIDER_SITE_OTHER): Payer: Self-pay | Admitting: *Deleted

## 2021-09-18 NOTE — Telephone Encounter (Signed)
error 

## 2021-09-19 ENCOUNTER — Telehealth (INDEPENDENT_AMBULATORY_CARE_PROVIDER_SITE_OTHER): Payer: Self-pay | Admitting: *Deleted

## 2021-09-19 NOTE — Telephone Encounter (Signed)
Discussed with patient per Dr. Levon Hedger - Glad to hear her constipation improved with stool softener Has she seen her GYN? May need to obtain their input as colonoscopy, CT and MRI have been unremarkable for another GI source OK to stop Elavil Patient verbalized understanding. Does not have a GYn and I advised her to call her pcp.

## 2021-09-19 NOTE — Telephone Encounter (Signed)
Glad to hear her constipation improved with stool softener Has she seen her GYN? May need to obtain their input as colonoscopy, CT and MRI have been unremarkable for another GI source OK to stop Elavil

## 2021-09-19 NOTE — Telephone Encounter (Signed)
Pt last seen 12/06/20. She had CT abdomen and pelvis and MRI. She states she is still having pain in pelvic area and you gave her amitriptyline 10mg  to take at bedtime. She stopped med about one month ago because it was not helping with pain. She states pain is more in pelvic area. She is taking an otc stool softner every day and states constipation is better as long as she takes stool softner.   [phone]

## 2021-10-15 ENCOUNTER — Ambulatory Visit (INDEPENDENT_AMBULATORY_CARE_PROVIDER_SITE_OTHER): Payer: Medicare Other | Admitting: Obstetrics & Gynecology

## 2021-10-15 ENCOUNTER — Encounter: Payer: Self-pay | Admitting: Obstetrics & Gynecology

## 2021-10-15 VITALS — BP 133/72 | HR 72 | Ht 62.5 in | Wt 145.8 lb

## 2021-10-15 DIAGNOSIS — R102 Pelvic and perineal pain: Secondary | ICD-10-CM | POA: Diagnosis not present

## 2021-10-15 NOTE — Progress Notes (Signed)
GYN VISIT Patient name: Christina Erickson MRN [id number]  Date of birth: 21-Apr-1950 Chief Complaint:   Gynecologic Exam (PCP told her to come get a pelvic exam because of some pain she is having. Was told she has a spot on her pelvis. )  History of Present Illness:   Christina Erickson is a 71 y.o. [phone] female being seen today for pelvic pain- initially thought it was for annual.     Pelvic pain: Reporting trouble with stomach- noting both constipation and diarrhea.  Notes discomfort like something is pulling, almost pressure near her bottom that radiates to her front. Feels like she has to use the bathroom.  Feels some improvement when she stands or passes flatus. Per pt seen by GI- imaging was done and was told to come her to check a spot on her pelvis.  Records reviewed- based on CT and MRI- no acute gyn abnormalities noted.   Today she denies vaginal bleeding, discharge, itching or irritation.    Reports last pap smear in early 60s.  Notes possible remote h/o abnormal pap when she was younger.  Previously seen at [facility].  Per pt @ 71yo she was told no further testing was needed.  No LMP recorded. Patient is postmenopausal.     10/15/2021   10:31 AM  Depression screen PHQ 2/9  Decreased Interest 3  Down, Depressed, Hopeless 0  PHQ - 2 Score 3  Altered sleeping 3  Tired, decreased energy 1  Change in appetite 0  Feeling bad or failure about yourself  0  Trouble concentrating 0  Moving slowly or fidgety/restless 0  Suicidal thoughts 0  PHQ-9 Score 7     Review of Systems:   Pertinent items are noted in HPI Denies fever/chills, dizziness, headaches, visual disturbances, fatigue, shortness of breath, chest pain, abdominal pain, vomiting.  Not sexually active. Pertinent History Reviewed:  Reviewed past medical,surgical, social, obstetrical and family history.  Reviewed problem list, medications and allergies. Physical Assessment:   Vitals:   10/15/21 1016   BP: 133/72  Pulse: 72  Weight: 145 lb 12.8 oz (66.1 kg)  Height: 5' 2.5" (1.588 m)  Body mass index is 26.24 kg/m.       Physical Examination:   General appearance: alert, well appearing, and in no distress  Psych: mood appropriate, normal affect  Skin: warm & dry   Cardiovascular: normal heart rate noted  Respiratory: normal respiratory effort, no distress  Abdomen: soft, non-tender, no rebound, no guarding, no masses appreciated  Pelvic: VULVA: normal appearing vulva with no masses, tenderness or lesions, VAGINA: normal appearing vagina with normal color and discharge, no lesions, CERVIX: normal appearing cervix without discharge or lesions, UTERUS: uterus is normal size, shape, consistency and nontender, ADNEXA: normal adnexa in size, nontender and no masses.  Slight firmness noted on right side  Extremities: no edema   Chaperone:  pt declined     Assessment & Plan:  1) Pelvic pain/discomfort -etiology of pain unclear though based on history seems GI in nature -plan for pelvic ultrasound to r/o underlying gynecologic etiology -further management pending results  2) Preventive screening -[]  plan to obtain records, confirmed with pt that @ 71yo if 3 tests negative within 59yrs no further testing indicated -continue mammograms yearly   Orders Placed This Encounter  Procedures   4yr PELVIC COMPLETE WITH TRANSVAGINAL    Return if symptoms worsen or fail to improve, for []  request records from [facility], []  schedule  at [facility].   Myna Hidalgo, DO Attending Obstetrician & Gynecologist, [facility] for [employer], [facility]

## 2021-10-25 ENCOUNTER — Ambulatory Visit (HOSPITAL_COMMUNITY)
Admission: RE | Admit: 2021-10-25 | Discharge: 2021-10-25 | Disposition: A | Discharge status: Home / Self Care | Payer: Medicare Other | Source: Ambulatory Visit | Attending: Obstetrics & Gynecology | Admitting: Obstetrics & Gynecology

## 2021-10-25 DIAGNOSIS — R102 Pelvic and perineal pain: Secondary | ICD-10-CM | POA: Diagnosis present

## 2021-10-30 ENCOUNTER — Telehealth: Payer: Self-pay | Admitting: Obstetrics & Gynecology

## 2021-10-30 NOTE — Telephone Encounter (Signed)
Tried calling patient- gentleman answered and stated that she was not available until after 3 pm

## 2021-10-31 ENCOUNTER — Telehealth: Payer: Self-pay | Admitting: Obstetrics & Gynecology

## 2021-10-31 NOTE — Telephone Encounter (Signed)
Patient is returning a call from you. She said she will at home tomorrow til about 12:45 pm.

## 2021-11-05 ENCOUNTER — Telehealth: Payer: Self-pay | Admitting: Obstetrics & Gynecology

## 2021-11-05 NOTE — Telephone Encounter (Signed)
Pt is requesting a call back about her [place] results. Please call before 12 or after 3 pm.

## 2021-11-07 ENCOUNTER — Telehealth: Payer: Self-pay | Admitting: Obstetrics & Gynecology

## 2021-11-07 NOTE — Telephone Encounter (Signed)
Reviewed ultrasound report.  No pelvic mass or free fluid.  While there is likely a small fibroid within the uterus this is not likely to be the source of her pain.  Discussed that fibroids are an area of concern typically during reproductive years with dysmenorrhea and HMB.  Since pt is postmenopausal and the fibroid is not only small but within the cavity this is not an area of concern nor the etiology of her discomfort.  Should she note vaginal bleeding that would be concerning and would require further work up.  Unfortunately at this time, do not know the "why" or cause of her pain.  Janyth Pupa, DO Attending [place], [facility] for [employer], [place]

## 2021-11-27 ENCOUNTER — Emergency Department (HOSPITAL_COMMUNITY)
Admission: EM | Admit: 2021-11-27 | Discharge: 2021-11-27 | Disposition: A | Discharge status: Home / Self Care | Payer: Medicare Other | Attending: Emergency Medicine | Admitting: Emergency Medicine

## 2021-11-27 ENCOUNTER — Encounter (HOSPITAL_COMMUNITY): Payer: Self-pay

## 2021-11-27 ENCOUNTER — Emergency Department (HOSPITAL_COMMUNITY): Payer: Medicare Other

## 2021-11-27 ENCOUNTER — Other Ambulatory Visit: Payer: Self-pay

## 2021-11-27 DIAGNOSIS — R1030 Lower abdominal pain, unspecified: Secondary | ICD-10-CM

## 2021-11-27 DIAGNOSIS — I10 Essential (primary) hypertension: Secondary | ICD-10-CM | POA: Insufficient documentation

## 2021-11-27 DIAGNOSIS — K5900 Constipation, unspecified: Secondary | ICD-10-CM | POA: Diagnosis not present

## 2021-11-27 DIAGNOSIS — Z79899 Other long term (current) drug therapy: Secondary | ICD-10-CM | POA: Diagnosis not present

## 2021-11-27 LAB — COMPREHENSIVE METABOLIC PANEL
ALT: 13 U/L (ref 0–44)
AST: 17 U/L (ref 15–41)
Albumin: 4.1 g/dL (ref 3.5–5.0)
Alkaline Phosphatase: 62 U/L (ref 38–126)
Anion gap: 6 (ref 5–15)
BUN: 17 mg/dL (ref 8–23)
CO2: 26 mmol/L (ref 22–32)
Calcium: 9.8 mg/dL (ref 8.9–10.3)
Chloride: 109 mmol/L (ref 98–111)
Creatinine, Ser: 0.86 mg/dL (ref 0.44–1.00)
GFR, Estimated: >60 mL/min (ref >60)
Glucose, Bld: 94 mg/dL (ref 70–99)
Potassium: 3.9 mmol/L (ref 3.5–5.1)
Sodium: 141 mmol/L (ref 135–145)
Total Bilirubin: 0.5 mg/dL (ref 0.3–1.2)
Total Protein: 7 g/dL (ref 6.5–8.1)

## 2021-11-27 LAB — URINALYSIS, ROUTINE W REFLEX MICROSCOPIC
Bilirubin Urine: NEGATIVE
Glucose, UA: NEGATIVE mg/dL
Hgb urine dipstick: NEGATIVE
Ketones, ur: NEGATIVE mg/dL
Leukocytes,Ua: NEGATIVE
Nitrite: NEGATIVE
Protein, ur: NEGATIVE mg/dL
Specific Gravity, Urine: 1.023 (ref 1.005–1.030)
pH: 5 (ref 5.0–8.0)

## 2021-11-27 LAB — CBC
HCT: 39.5 % (ref 36.0–46.0)
Hemoglobin: 12.9 g/dL (ref 12.0–15.0)
MCH: 29.9 pg (ref 26.0–34.0)
MCHC: 32.7 g/dL (ref 30.0–36.0)
MCV: 91.4 fL (ref 80.0–100.0)
Platelets: 321 10*3/uL (ref 150–400)
RBC: 4.32 MIL/uL (ref 3.87–5.11)
RDW: 14.1 % (ref 11.5–15.5)
WBC: 5.9 10*3/uL (ref 4.0–10.5)
nRBC: 0 % (ref 0.0–0.2)

## 2021-11-27 LAB — LIPASE, BLOOD: Lipase: 46 U/L (ref 11–51)

## 2021-11-27 MED ORDER — DICYCLOMINE HCL 20 MG PO TABS: 20.0000 mg | ORAL_TABLET | Freq: Two times a day (BID) | ORAL | 0 refills | Status: DC

## 2021-11-27 MED ORDER — IOHEXOL 300 MG/ML  SOLN
100.0000 mL | Freq: Once | INTRAMUSCULAR | Status: AC | PRN
  Administered 2021-11-27: 100 mL via INTRAVENOUS

## 2021-11-27 MED ORDER — DICYCLOMINE HCL 10 MG PO CAPS
10.0000 mg | ORAL_CAPSULE | Freq: Once | ORAL | Status: AC
  Administered 2021-11-27: 10 mg via ORAL
  Filled 2021-11-27: qty 1

## 2021-11-27 NOTE — ED Triage Notes (Signed)
CC: abdomen pain  Pt reports "I've been having a lot of trouble with my abdomen, I was diagnosed with fibroids." Pt reports abdomen pressure that radiates to the groin area.

## 2021-11-27 NOTE — ED Provider Notes (Signed)
[facility] EMERGENCY DEPARTMENT Provider Note   CSN: [id number] Arrival date & time: 11/27/21  1402     History  Chief Complaint  Patient presents with   Abdominal Pain    Christina Erickson is a 71 y.o. female with past medical history significant for high blood pressure, high cholesterol, arthritis, migraine, prediabetes who presents with concern for lower abdominal pain, radiating to back, with some obstipation, reporting pain like she needs to have a bowel movement but without any actual focal rectal pain that feels sharp in nature.  She denies previous history of hemorrhoids.  Patient reports that she has been dealing with similar pain for months, had a pelvic ultrasound at her OB/GYN showing single fibroid, but they do not think this is responsible for pain, she had a remote CT abdomen pelvis and MRI in December of last year which were overall unremarkable, patient reports the pain was more severe today, with difficulty standing up, bending.  She denies any nausea, vomiting, diarrhea, fever, chills, reports that she has intermittent constipation but it is improved with stool softeners.   Abdominal Pain      Home Medications Prior to Admission medications   Medication Sig Start Date End Date Taking? Authorizing Provider  dicyclomine (BENTYL) 20 MG tablet Take 1 tablet (20 mg total) by mouth 2 (two) times daily. 11/27/21  Yes Shulem Mader H, PA-C  albuterol (VENTOLIN HFA) 108 (90 Base) MCG/ACT inhaler Inhale into the lungs every 6 (six) hours as needed for wheezing or shortness of breath.    [provider]  amitriptyline (ELAVIL) 10 MG tablet Take 1 tablet (10 mg total) by mouth at bedtime. Patient not taking: Reported on 10/15/2021 03/22/21   Montez Morita, Quillian Quince, MD  amLODipine (NORVASC) 10 MG tablet Take 10 mg by mouth in the morning.    [provider]  benazepril (LOTENSIN) 40 MG tablet Take 40 mg by mouth in the morning.    [provider]  CALCIUM PO Take 1 tablet by mouth in the morning.    [provider]  Cholecalciferol (VITAMIN D3 PO) Take 1 tablet by mouth in the morning.    [provider]  fenofibrate (TRICOR) 48 MG tablet Take 48 mg by mouth daily.    [provider]  Omega-3 Fatty Acids (FISH OIL PO) Take 1 capsule by mouth daily. Patient not taking: Reported on 10/15/2021    [provider]      Allergies    Naproxen, Ezetimibe, and Statins    Review of Systems   Review of Systems  Gastrointestinal:  Positive for abdominal pain.  All other systems reviewed and are negative.   Physical Exam Updated Vital Signs BP (!) 155/71 (BP Location: Right Arm)   Pulse 69   Temp 98.2 F (36.8 C) (Oral)   Resp 17   Ht 5' 2.5" (1.588 m)   Wt 65.5 kg   SpO2 98%   BMI 25.97 kg/m  Physical Exam Vitals and nursing note reviewed.  Constitutional:      General: She is not in acute distress.    Appearance: Normal appearance.  HENT:     Head: Normocephalic and atraumatic.  Eyes:     General:        Right eye: No discharge.        Left eye: No discharge.  Cardiovascular:     Rate and Rhythm: Normal rate and regular rhythm.     Heart sounds: No murmur heard.  No friction rub. No gallop.  Pulmonary:     Effort: Pulmonary effort is normal.     Breath sounds: Normal breath sounds.  Abdominal:     General: Bowel sounds are normal.     Palpations: Abdomen is soft.     Comments: Minimal tenderness palpation lower abdomen  Skin:    General: Skin is warm and dry.     Capillary Refill: Capillary refill takes less than 2 seconds.  Neurological:     Mental Status: She is alert and oriented to person, place, and time.  Psychiatric:        Mood and Affect: Mood normal.        Behavior: Behavior normal.     ED Results / Procedures / Treatments   Labs (all labs ordered are listed, but only abnormal results are displayed) Labs Reviewed  URINALYSIS, ROUTINE W REFLEX MICROSCOPIC  - Abnormal; Notable for the following components:      Result Value   APPearance HAZY (*)    All other components within normal limits  LIPASE, BLOOD  COMPREHENSIVE METABOLIC PANEL  CBC    EKG None  Radiology CT ABDOMEN PELVIS W CONTRAST  Result Date: 11/27/2021 CLINICAL DATA:  Lower pelvic and rectal pain for 1 year, history of uterine fibroids EXAM: CT ABDOMEN AND PELVIS WITH CONTRAST TECHNIQUE: Multidetector CT imaging of the abdomen and pelvis was performed using the standard protocol following bolus administration of intravenous contrast. RADIATION DOSE REDUCTION: This exam was performed according to the departmental dose-optimization program which includes automated exposure control, adjustment of the mA and/or kV according to patient size and/or use of iterative reconstruction technique. CONTRAST:  195mL OMNIPAQUE IOHEXOL 300 MG/ML  SOLN COMPARISON:  10/25/2021, 01/24/2021, 01/11/2021 FINDINGS: Lower chest: No acute pleural or parenchymal lung disease. Hepatobiliary: Stable 1.3 cm hypodensity right lobe liver previously demonstrated to represent hemangioma. The remainder of the liver and gallbladder are unremarkable. No biliary duct dilation. Pancreas: Unremarkable. No pancreatic ductal dilatation or surrounding inflammatory changes. Spleen: Normal in size without focal abnormality. Adrenals/Urinary Tract: The kidneys are stable. No urinary tract calculi or obstructive uropathy. The adrenals and bladder are unremarkable. Stomach/Bowel: No bowel obstruction or ileus. Normal appendix right lower quadrant. No bowel wall thickening or inflammatory change. Small hiatal hernia. Vascular/Lymphatic: Aortic atherosclerosis. No enlarged abdominal or pelvic lymph nodes. Reproductive: Uterus and bilateral adnexa are unremarkable. Other: No free fluid or free intraperitoneal gas. Fat containing umbilical and bilateral inguinal hernias unchanged. No bowel herniation. Musculoskeletal: No acute or destructive  bony lesions. Stable multilevel lumbar spondylosis and facet hypertrophy. Reconstructed images demonstrate no additional findings. IMPRESSION: 1. No acute intra-abdominal or intrapelvic process. 2. Small hiatal hernia. 3. Fat containing umbilical and bilateral inguinal hernias. No bowel herniation. 4.  Aortic Atherosclerosis (ICD10-I70.0). Electronically Signed   By: Randa Ngo M.D.   On: 11/27/2021 18:36    Procedures Procedures    Medications Ordered in ED Medications  dicyclomine (BENTYL) capsule 10 mg (10 mg Oral Given 11/27/21 1919)  iohexol (OMNIPAQUE) 300 MG/ML solution 100 mL (100 mLs Intravenous Contrast Given 11/27/21 1809)    ED Course/ Medical Decision Making/ A&P                           Medical Decision Making Amount and/or Complexity of Data Reviewed Labs: ordered. Radiology: ordered.  Risk Prescription drug management.   This patient is a 71 y.o. female who presents to the ED for concern of lower  abdominal pain, questionable rectal pain versus tenesmus versus obstipation, this involves an extensive number of treatment options, and is a complaint that carries with it a high risk of complications and morbidity. The emergent differential diagnosis prior to evaluation includes, but is not limited to, anal fissure, hemorrhoids, diverticulitis, diverticulosis, IBD, other intra-abdominal abnormality, pelvic abnormality, fibroids, versus other.   This is not an exhaustive differential.   Past Medical History / Co-morbidities / Social History: Hypertension, high cholesterol, arthritis, prediabetes  Additional history: Chart reviewed. Pertinent results include: Reviewed outpatient GI, OB/GYN visits, patient seems to be having the symptoms off and on for almost a year plus at this point, she has been evaluated by both specialists with no significant findings other than some fibroids in the uterus, although she reports that she is only seen GI once, and they recommend that  she go to OB/GYN.  Physical Exam: Physical exam performed. The pertinent findings include: Patient with some very mild tenderness in the lower abdomen, I offered a rectal exam to evaluate for possible anal fissure, hemorrhoid, or other abnormality but patient declined, as she is not reporting actual pain of the rectum itself and more pain feeling like she needs to pass a bowel movement this is reasonable to decline at this time but discussed if she is continuing to have symptoms she will likely need an evaluation with rectal exam in the future.  Lab Tests: I ordered, and personally interpreted labs.  The pertinent results include: CBC CMP, lipase, and UA all unremarkable   Imaging Studies: I ordered imaging studies including CT abdomen pelvis with contrast. I independently visualized and interpreted imaging which showed patient with umbilical and inguinal hernias, but no prolapse of bowel, no other intra-abdominal findings to explain her symptoms, no large stool mass, rectal mass noted. I agree with the radiologist interpretation.   Medications: I ordered medication including Bentyl for cramping abdominal pain. Reevaluation of the patient after these medicines showed that the patient improved. I have reviewed the patients home medicines and have made adjustments as needed.   Disposition: After consideration of the diagnostic results and the patients response to treatment, I feel that patient is stable for disposition at this time, encouraged close follow-up with GI doctor, PCP for ongoing symptoms that are chronic in nature, I have some suspicion of IBD with constipation, but discussed it is difficult to assess at this time based on overall unremarkable work-up.Marland Kitchen   emergency department workup does not suggest an emergent condition requiring admission or immediate intervention beyond what has been performed at this time. The plan is: as above, will discharge with Bentyl to help with lower abdominal  cramping. The patient is safe for discharge and has been instructed to return immediately for worsening symptoms, change in symptoms or any other concerns.  I discussed this case with my attending physician Dr. Matilde Sprang who cosigned this note including patient's presenting symptoms, physical exam, and planned diagnostics and interventions. Attending physician stated agreement with plan or made changes to plan which were implemented.  ' Final Clinical Impression(s) / ED Diagnoses Final diagnoses:  Lower abdominal pain    Rx / DC Orders ED Discharge Orders          Ordered    dicyclomine (BENTYL) 20 MG tablet  2 times daily        11/27/21 1919              Dorien Chihuahua 11/27/21 2018    Teressa Lower, MD 12/04/21  0353  

## 2021-11-27 NOTE — Discharge Instructions (Addendum)
I recommend that you follow-up with your GI doctor for further evaluation

## 2021-11-27 NOTE — ED Notes (Signed)
Pt denies any abd pain or cramping. Pt states she only has had pain in her rectum. Last BM was today that pt describes as normal and soft.

## 2022-01-08 ENCOUNTER — Ambulatory Visit (HOSPITAL_COMMUNITY)
Admission: RE | Admit: 2022-01-08 | Discharge: 2022-01-08 | Disposition: A | Discharge status: Home / Self Care | Payer: Medicare Other | Source: Ambulatory Visit | Attending: Gastroenterology | Admitting: Gastroenterology

## 2022-01-08 ENCOUNTER — Ambulatory Visit (INDEPENDENT_AMBULATORY_CARE_PROVIDER_SITE_OTHER): Payer: Medicare Other | Admitting: Gastroenterology

## 2022-01-08 ENCOUNTER — Encounter: Payer: Self-pay | Admitting: Gastroenterology

## 2022-01-08 VITALS — BP 128/72 | HR 108 | Temp 97.8°F | Ht 62.5 in | Wt 142.8 lb

## 2022-01-08 DIAGNOSIS — M7918 Myalgia, other site: Secondary | ICD-10-CM

## 2022-01-08 DIAGNOSIS — M545 Low back pain, unspecified: Secondary | ICD-10-CM | POA: Insufficient documentation

## 2022-01-08 DIAGNOSIS — R109 Unspecified abdominal pain: Secondary | ICD-10-CM | POA: Diagnosis not present

## 2022-01-08 NOTE — Patient Instructions (Signed)
I have ordered an xray of your lower back.  I have also sent in a cream to [place]. Wear gloves when applying. Use as needed for rectal pain. Wash hands after using. They will call you when it is ready!  We will see you in 3 months!  It was a pleasure to see you today. I want to create trusting relationships with patients to provide genuine, compassionate, and quality care. I value your feedback. If you receive a survey regarding your visit,  I greatly appreciate you taking time to fill this out.   Gelene Mink, PhD, ANP-BC [facility]

## 2022-01-08 NOTE — Progress Notes (Addendum)
Gastroenterology Office Note     Primary Care Physician:  Wilmon Pali, FNP  Primary Gastroenterologist: Dr. Levon Hedger    Chief Complaint   Chief Complaint  Patient presents with   Follow-up    Patient here today due to a recent Ed visit at [facility] on 11/27/2021 due to Abdominal pain. Patient says she is still having issues with right lower side pain that radiates vbetween her legs and her lower back. Patient says she has some issues with rectal pain and has a hard time sitting down. Patient is not having any issues with constipation and says she has bm nearly every day as long as she takes her stool softener.      History of Present Illness   Christina Erickson is a 71 y.o. female presenting today in follow-up with a history of chronic abdominal pain, proctalgia fugax, last seen in Nov 2022. Colonoscopy in Feb 2023 with sigmoid diverticulosis and normal rectum and anal verge. History of liver lesion on CT, s/p MRI consistent with hemangioma.   Difficult historian. Returns today with ongoing upper buttock pain and at level of tailbone but no coccyx pain. Stool softener each evening and has BM daily. Pain radiates around to lower groin bilaterally and upper back. If standing, doesn't feel the pain as bad. Worsened with sitting. Hard to get comfortable to go to sleep. No pain with defecation. If passing gas, will have some relief. No knife like pain in rectum but does have discomfort at times in the rectum. No pain with BM. Occasional tingling down. No pain with eating.   CT Oct 2023: fat-containing umbilical and bilateral inguinal hernias  Past Medical History:  Diagnosis Date   Arthritis    Back pain 06/25/2017   High blood pressure    High cholesterol    Pre-diabetes     Past Surgical History:  Procedure Laterality Date   BREAST LUMPECTOMY WITH RADIOACTIVE SEED LOCALIZATION Left 08/20/2021   Procedure: LEFT BREAST RADIOACTIVE SEED LOCALIZED LUMPECTOMY;  Surgeon:  Manus Rudd, MD;  Location: [facility] OR;  Service: General;  Laterality: Left;  LMA   COLONOSCOPY WITH PROPOFOL N/A 03/22/2021   Procedure: COLONOSCOPY WITH PROPOFOL;  Surgeon: Dolores Frame, MD;  Location: [facility];  Service: Gastroenterology;  Laterality: N/A;  1215   IR FLUORO GUIDED NEEDLE PLC ASPIRATION/INJECTION LOC  06/25/2017    Current Outpatient Medications  Medication Sig Dispense Refill   albuterol (VENTOLIN HFA) 108 (90 Base) MCG/ACT inhaler Inhale into the lungs every 6 (six) hours as needed for wheezing or shortness of breath.     amLODipine (NORVASC) 10 MG tablet Take 10 mg by mouth in the morning.     benazepril (LOTENSIN) 40 MG tablet Take 40 mg by mouth in the morning.     CALCIUM PO Take 1 tablet by mouth in the morning.     Cholecalciferol (VITAMIN D3 PO) Take 1 tablet by mouth in the morning.     Omega-3 Fatty Acids (FISH OIL PO) Take 1 capsule by mouth daily.     dicyclomine (BENTYL) 20 MG tablet Take 1 tablet (20 mg total) by mouth 2 (two) times daily. (Patient not taking: Reported on 01/08/2022) 20 tablet 0   No current facility-administered medications for this visit.    Allergies as of 01/08/2022 - Review Complete 01/08/2022  Allergen Reaction Noted   Naproxen Nausea Only 06/25/2017   Ezetimibe Other (See Comments) 01/02/2021   Statins Other (See Comments) 01/02/2021  Family History  Problem Relation Age of Onset   Heart disease Other    Arthritis Other    Cancer Other    Diabetes Other     Social History   Socioeconomic History   Marital status: Widowed    Spouse name: Not on file   Number of children: Not on file   Years of education: Not on file   Highest education level: Not on file  Occupational History   Not on file  Tobacco Use   Smoking status: Some Days    Packs/day: 0.25    Years: 50.00    Total pack years: 12.50    Types: Cigarettes   Smokeless tobacco: Never   Tobacco comments:    encouraged to quit, declined  smoking cessation materials  Vaping Use   Vaping Use: Never used  Substance and Sexual Activity   Alcohol use: No   Drug use: Not Currently   Sexual activity: Not Currently  Other Topics Concern   Not on file  Social History Narrative   Not on file   Social Determinants of Health   Financial Resource Strain: Low Risk  (10/15/2021)   Overall Financial Resource Strain (CARDIA)    Difficulty of Paying Living Expenses: Not hard at all  Food Insecurity: No Food Insecurity (10/15/2021)   Hunger Vital Sign    Worried About Running Out of Food in the Last Year: Never true    Ran Out of Food in the Last Year: Never true  Transportation Needs: No Transportation Needs (10/15/2021)   PRAPARE - Administrator, Civil Service (Medical): No    Lack of Transportation (Non-Medical): No  Physical Activity: Inactive (10/15/2021)   Exercise Vital Sign    Days of Exercise per Week: 0 days    Minutes of Exercise per Session: 0 min  Stress: No Stress Concern Present (10/15/2021)   [employer] of Occupational Health - Occupational Stress Questionnaire    Feeling of Stress : Only a little  Social Connections: Unknown (10/15/2021)   Social Connection and Isolation Panel [NHANES]    Frequency of Communication with Friends and Family: More than three times a week    Frequency of Social Gatherings with Friends and Family: More than three times a week    Attends Religious Services: Patient refused    Active Member of Clubs or Organizations: No    Attends Banker Meetings: Patient refused    Marital Status: Widowed  Intimate Partner Violence: Not At Risk (10/15/2021)   Humiliation, Afraid, Rape, and Kick questionnaire    Fear of Current or Ex-Partner: No    Emotionally Abused: No    Physically Abused: No    Sexually Abused: No     Review of Systems   Gen: Denies any fever, chills, fatigue, weight loss, lack of appetite.  CV: Denies chest pain, heart palpitations, peripheral  edema, syncope.  Resp: Denies shortness of breath at rest or with exertion. Denies wheezing or cough.  GI: see HPI GU : Denies urinary burning, urinary frequency, urinary hesitancy MS: Denies joint pain, muscle weakness, cramps, or limitation of movement.  Derm: Denies rash, itching, dry skin Psych: Denies depression, anxiety, memory loss, and confusion Heme: Denies bruising, bleeding, and enlarged lymph nodes.   Physical Exam   BP 128/72 (BP Location: Right Arm, Patient Position: Sitting, Cuff Size: Small)   Pulse (!) 108   Temp 97.8 F (36.6 C) (Temporal)   Ht 5' 2.5" (1.588 m)   [employer]  142 lb 12.8 oz (64.8 kg)   BMI 25.70 kg/m  General:   Alert and oriented. Pleasant and cooperative. Well-nourished and well-developed.  Head:  Normocephalic and atraumatic. Eyes:  Without icterus Abdomen:  +BS, soft, non-tender and non-distended. No HSM noted. No guarding or rebound. No masses appreciated.  Rectal:  small external hemorrhoid skin tag, mild pressure with DRE, no pain Msk:  Symmetrical without gross deformities. Normal posture. Extremities:  Without edema. Neurologic:  Alert and  oriented x4;  grossly normal neurologically. Skin:  Intact without significant lesions or rashes. Psych:  Alert and cooperative. Normal mood and affect.   Assessment   Christina Erickson is a 71 y.o. female presenting today in follow-up with a history of chronic abdominal pain, proctalgia fugax, last seen in Nov 2022. Colonoscopy in Feb 2023 with sigmoid diverticulosis and normal rectum and anal verge. History of liver lesion on CT, s/p MRI consistent with hemangioma.   Interesting constellation of symptoms noted. Reports of upper buttock pain/lower back, radiating around to lower groin bilaterally but no abdominal pain. She also reports intermittent rectal discomfort with sitting. She is a difficult historian.   Not entirely convinced her buttock/groin pain is anything GI related. CT unrevealing. I am  ordering a lumbar xray. We will also treat for proctalgia fugax as well. May simply have two separate things going on.    PLAN    Lumbar xray with further recommendations to follow [facility] cream compounded with nitro  Return in 3 months    Gelene Mink, PhD, ANP-BC [facility] Gastroenterology   I have reviewed the note and agree with the APP's assessment as described in this progress note  Katrinka Blazing, MD Gastroenterology and Hepatology [facility] Gastroenterology

## 2022-01-13 ENCOUNTER — Telehealth (INDEPENDENT_AMBULATORY_CARE_PROVIDER_SITE_OTHER): Payer: Self-pay

## 2022-01-13 ENCOUNTER — Ambulatory Visit (INDEPENDENT_AMBULATORY_CARE_PROVIDER_SITE_OTHER): Payer: Medicare Other | Admitting: Gastroenterology

## 2022-01-13 NOTE — Telephone Encounter (Signed)
Patient called for her results of her Lumbar xray, I made her aware of the findings and she is aware to contact the pcp.  Lumbar xray with degenerative disease. I recommend following up with PCP regarding low back pain. Unclear etiology of buttock pain; we are treating for proctalgia fugax as well.

## 2022-02-10 ENCOUNTER — Telehealth: Payer: Self-pay

## 2022-02-10 NOTE — Telephone Encounter (Signed)
Pt phoned again regarding pain in her backside. She states she can hardly sit down or to lay down @ night. I asked he was she constipated and she stated no. The pt is having a BM everyday. She states she had injections in her back Dec 27th. She states that her lower back is not the problem. Please advise

## 2022-02-11 NOTE — Telephone Encounter (Signed)
Is she using the compounded cream I sent to the pharmacy QID?

## 2022-02-11 NOTE — Telephone Encounter (Signed)
Noted  Please schedule the pt an appt to be seen per Roseanne Kaufman

## 2022-02-11 NOTE — Telephone Encounter (Signed)
Let's see if we can get her in for an appt.

## 2022-02-11 NOTE — Telephone Encounter (Signed)
Phoned and asked the pt is she using the compounded cream and she stated yes but she has used it up but it didn't help much. Please advise

## 2022-02-12 ENCOUNTER — Ambulatory Visit: Payer: Medicare Other | Admitting: Orthopedic Surgery

## 2022-02-13 NOTE — Telephone Encounter (Signed)
noted 

## 2022-02-24 ENCOUNTER — Ambulatory Visit (INDEPENDENT_AMBULATORY_CARE_PROVIDER_SITE_OTHER): Payer: 59 | Admitting: Orthopedic Surgery

## 2022-02-24 ENCOUNTER — Encounter: Payer: Self-pay | Admitting: Orthopedic Surgery

## 2022-02-24 VITALS — BP 153/71 | HR 85 | Ht 62.5 in | Wt 140.0 lb

## 2022-02-24 DIAGNOSIS — M5136 Other intervertebral disc degeneration, lumbar region: Secondary | ICD-10-CM | POA: Diagnosis not present

## 2022-02-24 MED ORDER — TIZANIDINE HCL 4 MG PO TABS: 4.0000 mg | ORAL_TABLET | Freq: Four times a day (QID) | ORAL | 0 refills | Status: DC | PRN

## 2022-02-24 MED ORDER — IBUPROFEN 800 MG PO TABS: 800.0000 mg | ORAL_TABLET | Freq: Three times a day (TID) | ORAL | 1 refills | Status: DC | PRN

## 2022-02-24 NOTE — Progress Notes (Signed)
Patient has declined physical therapy at this time she would like to wait until she has upcoming surgery for a fibroid tumor. I told her to call back when she is ready we can send order then.

## 2022-02-24 NOTE — Patient Instructions (Addendum)
For pain  Take 500 mg of tylenol every 6 hrs and the medications listed below   Meds ordered this encounter  Medications   ibuprofen (ADVIL) 800 MG tablet    Sig: Take 1 tablet (800 mg total) by mouth every 8 (eight) hours as needed.    Dispense:  90 tablet    Refill:  1   tiZANidine (ZANAFLEX) 4 MG tablet    Sig: Take 1 tablet (4 mg total) by mouth every 6 (six) hours as needed for muscle spasms.    Dispense:  30 tablet    Refill:  0

## 2022-02-24 NOTE — Progress Notes (Addendum)
Patient ID: Christina Erickson, female   DOB: November 14, 1950, 72 y.o.   MRN: [id number]    ASSESSMENT AND PLAN: Encounter Diagnosis  Name Primary?   DDD (degenerative disc disease), lumbar Yes   Meds ordered this encounter  Medications   ibuprofen (ADVIL) 800 MG tablet    Sig: Take 1 tablet (800 mg total) by mouth every 8 (eight) hours as needed.    Dispense:  90 tablet    Refill:  1   tiZANidine (ZANAFLEX) 4 MG tablet    Sig: Take 1 tablet (4 mg total) by mouth every 6 (six) hours as needed for muscle spasms.    Dispense:  30 tablet    Refill:  0   Return after 6 weeks of physical therapy  Chief Complaint  Patient presents with   Back Pain    For several months feels pressure with sitting       HPI Christina Erickson is a 72 y.o. female.  Presents with several months of pain in her lower back.  The pain is bilateral.  It radiates to the knees sometimes and then into the feet at times.  Patient notes she has pain in the supine position as well as problems sitting and driving.  The pain is relieved when she is standing.  She has no history of cancer no saddle anesthesia  Allergies  Allergen Reactions   Naproxen Nausea Only   Ezetimibe Other (See Comments)    "Pins and needles"   Statins Other (See Comments)    "Pins and needles"      Review of Systems No red flags  Past Medical History:  Diagnosis Date   Arthritis    Back pain 06/25/2017   High blood pressure    High cholesterol    Pre-diabetes     Past Surgical History:  Procedure Laterality Date   BREAST LUMPECTOMY WITH RADIOACTIVE SEED LOCALIZATION Left 08/20/2021   Procedure: LEFT BREAST RADIOACTIVE SEED LOCALIZED LUMPECTOMY;  Surgeon: Donnie Mesa, MD;  Location: [facility];  Service: General;  Laterality: Left;  LMA   COLONOSCOPY WITH PROPOFOL N/A 03/22/2021   sigmoid diverticulosis and normal rectum and anal verge.   IR FLUORO GUIDED NEEDLE PLC ASPIRATION/INJECTION LOC  06/25/2017    Family History   Problem Relation Age of Onset   Heart disease Other    Arthritis Other    Cancer Other    Diabetes Other    was reviewed  Social History Social History   Tobacco Use   Smoking status: Some Days    Packs/day: 0.25    Years: 50.00    Total pack years: 12.50    Types: Cigarettes   Smokeless tobacco: Never   Tobacco comments:    encouraged to quit, declined smoking cessation materials  Vaping Use   Vaping Use: Never used  Substance Use Topics   Alcohol use: No   Drug use: Not Currently    Allergies  Allergen Reactions   Naproxen Nausea Only   Ezetimibe Other (See Comments)    "Pins and needles"   Statins Other (See Comments)    "Pins and needles"     Current Outpatient Medications  Medication Sig Dispense Refill   albuterol (VENTOLIN HFA) 108 (90 Base) MCG/ACT inhaler Inhale into the lungs every 6 (six) hours as needed for wheezing or shortness of breath.     amLODipine (NORVASC) 10 MG tablet Take 10 mg by mouth in the morning.     benazepril (LOTENSIN) 40 MG tablet  Take 40 mg by mouth in the morning.     CALCIUM PO Take 1 tablet by mouth in the morning.     Cholecalciferol (VITAMIN D3 PO) Take 1 tablet by mouth in the morning.     dicyclomine (BENTYL) 20 MG tablet Take 1 tablet (20 mg total) by mouth 2 (two) times daily. (Patient not taking: Reported on 01/08/2022) 20 tablet 0   Omega-3 Fatty Acids (FISH OIL PO) Take 1 capsule by mouth daily.     No current facility-administered medications for this visit.       Physical Exam There were no vitals taken for this visit.  Gen. appearance: The patient is well-developed and well-nourished grooming and hygiene are normal The patient is oriented to person place and time The patient's mood is normal and the affect is normal   Gait assessment: The patient stands with ab- normal gait and station  Lumbar spine Tenderness  to palpation is noted in the lower L4-5 and 5 S1 segment  Range of motion decreased Muscle  tone   increased normal on the right and left sides of the spine  Lower extremities right and left Normal range of motion hip knee and ankle All 3 joints are reduced and stable  Strength right lower extremity L2-S1 NORMAL EXCEPT normal Strength left lower extremity L2-S1 NORMAL EXCEPT normal  Neurologic right lower extremity examination  Reflexes were 2+ and equal at the knee and 1+ and equal at the ankle    Sensation was normal in both feet and legs    Babinski's tests were down going  Straight leg raise testing   The vascular examination revealed normal dorsalis pedis pulses in both feet and both feet were warm with good capillary refill    MEDICAL DECISION MAKING  A.  Encounter Diagnosis  Name Primary?   DDD (degenerative disc disease), lumbar Yes     B. DATA ANALYSED:    IMAGING: Independent interpretation of images: Outside imaging lumbar spine abnormalities found included abnormal alignment in the coronal plane facet arthritis degenerative disc disease  Orders: Physical therapy  Outside records reviewed: No  C. MANAGEMENT conservative management  Meds ordered this encounter  Medications   ibuprofen (ADVIL) 800 MG tablet    Sig: Take 1 tablet (800 mg total) by mouth every 8 (eight) hours as needed.    Dispense:  90 tablet    Refill:  1   tiZANidine (ZANAFLEX) 4 MG tablet    Sig: Take 1 tablet (4 mg total) by mouth every 6 (six) hours as needed for muscle spasms.    Dispense:  30 tablet    Refill:  0    Follow-up after 8 weeks after she has had 6 weeks of physical therapy  No orders of the defined types were placed in this encounter.

## 2022-03-19 ENCOUNTER — Other Ambulatory Visit (HOSPITAL_COMMUNITY)
Admission: RE | Admit: 2022-03-19 | Discharge: 2022-03-19 | Disposition: A | Discharge status: Home / Self Care | Payer: 59 | Source: Ambulatory Visit | Attending: Obstetrics & Gynecology | Admitting: Obstetrics & Gynecology

## 2022-03-19 ENCOUNTER — Encounter: Payer: Self-pay | Admitting: Obstetrics & Gynecology

## 2022-03-19 ENCOUNTER — Ambulatory Visit (INDEPENDENT_AMBULATORY_CARE_PROVIDER_SITE_OTHER): Payer: 59 | Admitting: Obstetrics & Gynecology

## 2022-03-19 VITALS — BP 138/73 | HR 74 | Ht 62.0 in | Wt 142.4 lb

## 2022-03-19 DIAGNOSIS — Z01419 Encounter for gynecological examination (general) (routine) without abnormal findings: Secondary | ICD-10-CM | POA: Diagnosis not present

## 2022-03-19 DIAGNOSIS — Z124 Encounter for screening for malignant neoplasm of cervix: Secondary | ICD-10-CM | POA: Diagnosis present

## 2022-03-19 DIAGNOSIS — R102 Pelvic and perineal pain unspecified side: Secondary | ICD-10-CM

## 2022-03-19 DIAGNOSIS — Z1151 Encounter for screening for human papillomavirus (HPV): Secondary | ICD-10-CM | POA: Diagnosis not present

## 2022-03-19 NOTE — Progress Notes (Signed)
   GYN VISIT Patient name: Christina Erickson MRN [id number]  Date of birth: [birth year] Chief Complaint:   right lower quad pain at night  History of Present Illness:   Christina Erickson is a 72 y.o. [id number] PM female being seen today for the following concerns:  -Pelvic pain: On occasion will have right-sided pain that has been going on for the past 2 mos.  Pain is mostly at night and sharp intermittent pain.  Pain is non-radiating. Rates her pain 9/10.  Tried some medication that was given to her by Ortho and doesn't seem to help.  In a week, states the pain may happen about twice.       Notes some constipation.  Denies any urinary concerns- denies dysuria or hematuria.  No LMP recorded. Patient is postmenopausal.     10/15/2021   10:31 AM  Depression screen PHQ 2/9  Decreased Interest 3  Down, Depressed, Hopeless 0  PHQ - 2 Score 3  Altered sleeping 3  Tired, decreased energy 1  Change in appetite 0  Feeling bad or failure about yourself  0  Trouble concentrating 0  Moving slowly or fidgety/restless 0  Suicidal thoughts 0  PHQ-9 Score 7     Review of Systems:   Pertinent items are noted in HPI Denies fever/chills, dizziness, headaches, visual disturbances, fatigue, shortness of breath, chest pain, abdominal pain, vomiting. Pertinent History Reviewed:  Reviewed past medical,surgical, social, obstetrical and family history.  Reviewed problem list, medications and allergies. Physical Assessment:   Vitals:   03/19/22 0853  BP: 138/73  Pulse: 74  Weight: 142 lb 6.4 oz (64.6 kg)  Height: 5\' 2"  (1.575 m)  Body mass index is 26.05 kg/m.       Physical Examination:   General appearance: alert, well appearing, and in no distress  Psych: mood appropriate, normal affect  Skin: warm & dry   Cardiovascular: normal heart rate noted  Respiratory: normal respiratory effort, no distress  Abdomen: soft, non-tender   Pelvic: VULVA: normal appearing vulva with no masses,  tenderness or lesions, VAGINA: normal appearing vagina with normal color and discharge, no lesions, CERVIX: normal appearing cervix without discharge or lesions, UTERUS: uterus is normal size, shape, consistency and nontender, ADNEXA: normal adnexa in size, nontender and no masses  Extremities: no edema   Chaperone:  pt declined     Assessment & Plan:  1) Pelvic pain -reviewed prior imaging- [place] 10/2021 and CT 11/2021- no abnormalities noted -reviewed concerns regarding fibroids and reassured pt that surgery is not indicated -do not think pain is gynecologic in nature -pt has GI appointment scheduled later this week  2) Cervical cancer screening -pap collected as there are no prior records of normal results -reviewed ASCCP guidelines -if negative test today, no further pap indicated   Return in about 2 years (around 03/19/2024), or if symptoms worsen or fail to improve.   Janyth Pupa, DO Attending [place], [facility] for [employer], [place]

## 2022-03-20 ENCOUNTER — Ambulatory Visit (INDEPENDENT_AMBULATORY_CARE_PROVIDER_SITE_OTHER): Payer: 59 | Admitting: Gastroenterology

## 2022-03-20 ENCOUNTER — Encounter: Payer: Self-pay | Admitting: Gastroenterology

## 2022-03-20 VITALS — BP 135/72 | HR 76 | Temp 98.2°F | Ht 63.0 in | Wt 142.8 lb

## 2022-03-20 DIAGNOSIS — K6289 Other specified diseases of anus and rectum: Secondary | ICD-10-CM | POA: Diagnosis not present

## 2022-03-20 NOTE — Progress Notes (Addendum)
Gastroenterology Office Note     Primary Care Physician:  Coolidge Breeze, FNP  Primary Gastroenterologist: Dr. Jenetta Downer    Chief Complaint   Chief Complaint  Patient presents with   Rectal Pain    Still having rectal pain/discomfort. Cream she was given last time did not work.      History of Present Illness   MIKKA VOSBURGH is a 72 y.o. female presenting today in follow-up with a history of chronic abdominal pain, proctalgia fugax, last seen in Dec 2023 with interesting symptoms of lower back pain and also concern for anal fissure. Colonoscopy in Feb 2023 with sigmoid diverticulosis and normal rectum and anal verge. History of liver lesion on CT, s/p MRI consistent with hemangioma.    When sitting on flat surface, has pressure in rectum. Seeing Dr. Aline Brochure for DDD. Used the cream four times a day. No improvement with it. No pain with passing BM but actually feels better after BM passes. BM once or twice in morning. Rare straining. Stool soft. Feels better when standing up. Takes stool softener each evening.         Past Medical History:  Diagnosis Date   Arthritis    Back pain 06/25/2017   High blood pressure    High cholesterol    Pre-diabetes     Past Surgical History:  Procedure Laterality Date   BREAST LUMPECTOMY WITH RADIOACTIVE SEED LOCALIZATION Left 08/20/2021   Procedure: LEFT BREAST RADIOACTIVE SEED LOCALIZED LUMPECTOMY;  Surgeon: Donnie Mesa, MD;  Location: [place];  Service: General;  Laterality: Left;  LMA   CESAREAN SECTION     COLONOSCOPY WITH PROPOFOL N/A 03/22/2021   sigmoid diverticulosis and normal rectum and anal verge.   IR FLUORO GUIDED NEEDLE PLC ASPIRATION/INJECTION LOC  06/25/2017   TUBAL LIGATION      Current Outpatient Medications  Medication Sig Dispense Refill   albuterol (VENTOLIN HFA) 108 (90 Base) MCG/ACT inhaler Inhale into the lungs every 6 (six) hours as needed for wheezing or shortness of breath.     amLODipine  (NORVASC) 10 MG tablet Take 10 mg by mouth in the morning.     benazepril (LOTENSIN) 40 MG tablet Take 40 mg by mouth in the morning.     CALCIUM PO Take 1 tablet by mouth in the morning.     Cholecalciferol (VITAMIN D3 PO) Take 1 tablet by mouth in the morning.     cholestyramine (QUESTRAN) 4 g packet Take 4 g by mouth 3 (three) times daily with meals.     fenofibrate (TRICOR) 48 MG tablet Take 48 mg by mouth daily.     ibuprofen (ADVIL) 800 MG tablet Take 1 tablet (800 mg total) by mouth every 8 (eight) hours as needed. 90 tablet 1   tiZANidine (ZANAFLEX) 4 MG tablet Take 1 tablet (4 mg total) by mouth every 6 (six) hours as needed for muscle spasms. 30 tablet 0   No current facility-administered medications for this visit.    Allergies as of 03/20/2022 - Review Complete 03/20/2022  Allergen Reaction Noted   Naproxen Nausea Only 06/25/2017   Ezetimibe Other (See Comments) 01/02/2021   Statins Other (See Comments) 01/02/2021    Family History  Problem Relation Age of Onset   Heart disease Other    Arthritis Other    Cancer Other    Diabetes Other     Social History   Socioeconomic History   Marital status: Widowed    Spouse name:  Not on file   Number of children: Not on file   Years of education: Not on file   Highest education level: Not on file  Occupational History   Not on file  Tobacco Use   Smoking status: Every Day    Packs/day: 0.25    Years: 50.00    Total pack years: 12.50    Types: Cigarettes   Smokeless tobacco: Never   Tobacco comments:    encouraged to quit, declined smoking cessation materials  Vaping Use   Vaping Use: Never used  Substance and Sexual Activity   Alcohol use: No   Drug use: Not Currently   Sexual activity: Not Currently    Birth control/protection: Post-menopausal  Other Topics Concern   Not on file  Social History Narrative   Not on file   Social Determinants of Health   Financial Resource Strain: Low Risk  (10/15/2021)    Overall Financial Resource Strain (CARDIA)    Difficulty of Paying Living Expenses: Not hard at all  Food Insecurity: No Food Insecurity (10/15/2021)   Hunger Vital Sign    Worried About Running Out of Food in the Last Year: Never true    Ran Out of Food in the Last Year: Never true  Transportation Needs: No Transportation Needs (10/15/2021)   PRAPARE - Hydrologist (Medical): No    Lack of Transportation (Non-Medical): No  Physical Activity: Inactive (10/15/2021)   Exercise Vital Sign    Days of Exercise per Week: 0 days    Minutes of Exercise per Session: 0 min  Stress: No Stress Concern Present (10/15/2021)   [place]    Feeling of Stress : Only a little  Social Connections: Unknown (10/15/2021)   Social Connection and Isolation Panel [NHANES]    Frequency of Communication with Friends and Family: More than three times a week    Frequency of Social Gatherings with Friends and Family: More than three times a week    Attends Religious Services: Patient refused    Active Member of Clubs or Organizations: No    Attends Archivist Meetings: Patient refused    Marital Status: Widowed  Intimate Partner Violence: Not At Risk (10/15/2021)   Humiliation, Afraid, Rape, and Kick questionnaire    Fear of Current or Ex-Partner: No    Emotionally Abused: No    Physically Abused: No    Sexually Abused: No     Review of Systems   Gen: Denies any fever, chills, fatigue, weight loss, lack of appetite.  CV: Denies chest pain, heart palpitations, peripheral edema, syncope.  Resp: Denies shortness of breath at rest or with exertion. Denies wheezing or cough.  GI: Denies dysphagia or odynophagia. Denies jaundice, hematemesis, fecal incontinence. GU : Denies urinary burning, urinary frequency, urinary hesitancy MS: Denies joint pain, muscle weakness, cramps, or limitation of movement.  Derm:  Denies rash, itching, dry skin Psych: Denies depression, anxiety, memory loss, and confusion Heme: Denies bruising, bleeding, and enlarged lymph nodes.   Physical Exam   BP 135/72 (BP Location: Right Arm, Patient Position: Sitting, Cuff Size: Normal)   Pulse 76   Temp 98.2 F (36.8 C) (Oral)   Ht 5' 3"$  (1.6 m)   Wt 142 lb 12.8 oz (64.8 kg)   SpO2 97%   BMI 25.30 kg/m  General:   Alert and oriented. Pleasant and cooperative. Well-nourished and well-developed.  Head:  Normocephalic and atraumatic. Eyes:  Without icterus Rectal:  No mass, some mild discomfort with DRE, in left lateral laying position, anoscopy performed. Possible anal fissure at 9 oclock position  Msk:  Symmetrical without gross deformities. Normal posture. Extremities:  Without edema. Neurologic:  Alert and  oriented x4;  grossly normal neurologically. Skin:  Intact without significant lesions or rashes. Psych:  Alert and cooperative. Normal mood and affect.   Assessment   SHAKIEA RENDELL is a 72 y.o. female presenting today in follow-up with a history of  chronic abdominal pain, proctalgia fugax, last seen in Dec 2023 with interesting symptoms of lower back pain and also concern for anal fissure. Colonoscopy in Feb 2023 with sigmoid diverticulosis and normal rectum and anal verge. History of liver lesion on CT, s/p MRI consistent with hemangioma.   Back pain: improved and seeing ortho.   Rectal pain: interesting symptoms as no significant pain when passing stool but notes pressure and pain with sitting down or on a flat surface. Possible anal fissure on exam. No significant improvement with compounded cream with Nitro. Will trial Nifedipine. May need to do flex sig for direct visualization if no improvement. CT abd/pelvis with contrast on 10/23 for similar symptoms without acute findings.    PLAN    Called in compounded hemorrhoid cream to [place] with nifedipine instead of Nitro.  Progress  report in 2 weeks Consider flex sig   Annitta Needs, PhD, ANP-BC [facility]   I have reviewed the note and agree with the APP's assessment as described in this progress note  Maylon Peppers, MD Gastroenterology and [place] [facility]

## 2022-03-20 NOTE — Patient Instructions (Signed)
I am sending in a different formulation to [place] when they open at [place]. Wait for them to call you.  Please call me in about 1-2 weeks with an update. We may need to do a flex sig to look inside or refer to a surgeon for further evaluation.   I enjoyed seeing you again today! At our first visit, I mentioned how I value our relationship and want to provide genuine, compassionate, and quality care. You may receive a survey regarding your visit with me, and I welcome your feedback! Thanks so much for taking the time to complete this. I look forward to seeing you again.   Annitta Needs, PhD, ANP-BC [facility]

## 2022-03-20 NOTE — H&P (View-Only) (Signed)
Gastroenterology Office Note     Primary Care Physician:  Coolidge Breeze, FNP  Primary Gastroenterologist: Dr. Jenetta Downer    Chief Complaint   Chief Complaint  Patient presents with   Rectal Pain    Still having rectal pain/discomfort. Cream she was given last time did not work.      History of Present Illness   Christina Erickson is a 72 y.o. female presenting today in follow-up with a history of chronic abdominal pain, proctalgia fugax, last seen in Dec 2023 with interesting symptoms of lower back pain and also concern for anal fissure. Colonoscopy in Feb 2023 with sigmoid diverticulosis and normal rectum and anal verge. History of liver lesion on CT, s/p MRI consistent with hemangioma.    When sitting on flat surface, has pressure in rectum. Seeing Dr. Aline Brochure for DDD. Used the cream four times a day. No improvement with it. No pain with passing BM but actually feels better after BM passes. BM once or twice in morning. Rare straining. Stool soft. Feels better when standing up. Takes stool softener each evening.         Past Medical History:  Diagnosis Date   Arthritis    Back pain 06/25/2017   High blood pressure    High cholesterol    Pre-diabetes     Past Surgical History:  Procedure Laterality Date   BREAST LUMPECTOMY WITH RADIOACTIVE SEED LOCALIZATION Left 08/20/2021   Procedure: LEFT BREAST RADIOACTIVE SEED LOCALIZED LUMPECTOMY;  Surgeon: Donnie Mesa, MD;  Location: [place];  Service: General;  Laterality: Left;  LMA   CESAREAN SECTION     COLONOSCOPY WITH PROPOFOL N/A 03/22/2021   sigmoid diverticulosis and normal rectum and anal verge.   IR FLUORO GUIDED NEEDLE PLC ASPIRATION/INJECTION LOC  06/25/2017   TUBAL LIGATION      Current Outpatient Medications  Medication Sig Dispense Refill   albuterol (VENTOLIN HFA) 108 (90 Base) MCG/ACT inhaler Inhale into the lungs every 6 (six) hours as needed for wheezing or shortness of breath.     amLODipine  (NORVASC) 10 MG tablet Take 10 mg by mouth in the morning.     benazepril (LOTENSIN) 40 MG tablet Take 40 mg by mouth in the morning.     CALCIUM PO Take 1 tablet by mouth in the morning.     Cholecalciferol (VITAMIN D3 PO) Take 1 tablet by mouth in the morning.     cholestyramine (QUESTRAN) 4 g packet Take 4 g by mouth 3 (three) times daily with meals.     fenofibrate (TRICOR) 48 MG tablet Take 48 mg by mouth daily.     ibuprofen (ADVIL) 800 MG tablet Take 1 tablet (800 mg total) by mouth every 8 (eight) hours as needed. 90 tablet 1   tiZANidine (ZANAFLEX) 4 MG tablet Take 1 tablet (4 mg total) by mouth every 6 (six) hours as needed for muscle spasms. 30 tablet 0   No current facility-administered medications for this visit.    Allergies as of 03/20/2022 - Review Complete 03/20/2022  Allergen Reaction Noted   Naproxen Nausea Only 06/25/2017   Ezetimibe Other (See Comments) 01/02/2021   Statins Other (See Comments) 01/02/2021    Family History  Problem Relation Age of Onset   Heart disease Other    Arthritis Other    Cancer Other    Diabetes Other     Social History   Socioeconomic History   Marital status: Widowed    Spouse name:  Not on file   Number of children: Not on file   Years of education: Not on file   Highest education level: Not on file  Occupational History   Not on file  Tobacco Use   Smoking status: Every Day    Packs/day: 0.25    Years: 50.00    Total pack years: 12.50    Types: Cigarettes   Smokeless tobacco: Never   Tobacco comments:    encouraged to quit, declined smoking cessation materials  Vaping Use   Vaping Use: Never used  Substance and Sexual Activity   Alcohol use: No   Drug use: Not Currently   Sexual activity: Not Currently    Birth control/protection: Post-menopausal  Other Topics Concern   Not on file  Social History Narrative   Not on file   Social Determinants of Health   Financial Resource Strain: Low Risk  (10/15/2021)    Overall Financial Resource Strain (CARDIA)    Difficulty of Paying Living Expenses: Not hard at all  Food Insecurity: No Food Insecurity (10/15/2021)   Hunger Vital Sign    Worried About Running Out of Food in the Last Year: Never true    Ran Out of Food in the Last Year: Never true  Transportation Needs: No Transportation Needs (10/15/2021)   PRAPARE - Hydrologist (Medical): No    Lack of Transportation (Non-Medical): No  Physical Activity: Inactive (10/15/2021)   Exercise Vital Sign    Days of Exercise per Week: 0 days    Minutes of Exercise per Session: 0 min  Stress: No Stress Concern Present (10/15/2021)   [place]    Feeling of Stress : Only a little  Social Connections: Unknown (10/15/2021)   Social Connection and Isolation Panel [NHANES]    Frequency of Communication with Friends and Family: More than three times a week    Frequency of Social Gatherings with Friends and Family: More than three times a week    Attends Religious Services: Patient refused    Active Member of Clubs or Organizations: No    Attends Archivist Meetings: Patient refused    Marital Status: Widowed  Intimate Partner Violence: Not At Risk (10/15/2021)   Humiliation, Afraid, Rape, and Kick questionnaire    Fear of Current or Ex-Partner: No    Emotionally Abused: No    Physically Abused: No    Sexually Abused: No     Review of Systems   Gen: Denies any fever, chills, fatigue, weight loss, lack of appetite.  CV: Denies chest pain, heart palpitations, peripheral edema, syncope.  Resp: Denies shortness of breath at rest or with exertion. Denies wheezing or cough.  GI: Denies dysphagia or odynophagia. Denies jaundice, hematemesis, fecal incontinence. GU : Denies urinary burning, urinary frequency, urinary hesitancy MS: Denies joint pain, muscle weakness, cramps, or limitation of movement.  Derm:  Denies rash, itching, dry skin Psych: Denies depression, anxiety, memory loss, and confusion Heme: Denies bruising, bleeding, and enlarged lymph nodes.   Physical Exam   BP 135/72 (BP Location: Right Arm, Patient Position: Sitting, Cuff Size: Normal)   Pulse 76   Temp 98.2 F (36.8 C) (Oral)   Ht '5\' 3"'$  (1.6 m)   Wt 142 lb 12.8 oz (64.8 kg)   SpO2 97%   BMI 25.30 kg/m  General:   Alert and oriented. Pleasant and cooperative. Well-nourished and well-developed.  Head:  Normocephalic and atraumatic. Eyes:  Without icterus Rectal:  No mass, some mild discomfort with DRE, in left lateral laying position, anoscopy performed. Possible anal fissure at 9 oclock position  Msk:  Symmetrical without gross deformities. Normal posture. Extremities:  Without edema. Neurologic:  Alert and  oriented x4;  grossly normal neurologically. Skin:  Intact without significant lesions or rashes. Psych:  Alert and cooperative. Normal mood and affect.   Assessment   Christina Erickson is a 72 y.o. female presenting today in follow-up with a history of  chronic abdominal pain, proctalgia fugax, last seen in Dec 2023 with interesting symptoms of lower back pain and also concern for anal fissure. Colonoscopy in Feb 2023 with sigmoid diverticulosis and normal rectum and anal verge. History of liver lesion on CT, s/p MRI consistent with hemangioma.   Back pain: improved and seeing ortho.   Rectal pain: interesting symptoms as no significant pain when passing stool but notes pressure and pain with sitting down or on a flat surface. Possible anal fissure on exam. No significant improvement with compounded cream with Nitro. Will trial Nifedipine. May need to do flex sig for direct visualization if no improvement. CT abd/pelvis with contrast on 10/23 for similar symptoms without acute findings.    PLAN    Called in compounded hemorrhoid cream to [place] with nifedipine instead of Nitro.  Progress  report in 2 weeks Consider flex sig   Annitta Needs, PhD, ANP-BC [facility]   I have reviewed the note and agree with the APP's assessment as described in this progress note  Maylon Peppers, MD Gastroenterology and [place] [facility]

## 2022-03-26 LAB — CYTOLOGY - PAP
Comment: NEGATIVE
Diagnosis: NEGATIVE
High risk HPV: NEGATIVE

## 2022-04-02 ENCOUNTER — Telehealth: Payer: Self-pay

## 2022-04-02 NOTE — Telephone Encounter (Signed)
Pt returned call and she advised me that the cream you prescribed has not helped her. Please advise of what to do next

## 2022-04-02 NOTE — Telephone Encounter (Signed)
Returned the call left on my vm. LM with the pt husband to return call

## 2022-04-03 NOTE — Telephone Encounter (Signed)
She likely needs a flex sig. Interesting symptoms. Mindy/Tammy: I recommend flex sig with Dr. Jenetta Downer due to persistent rectal pain. ASA 2.

## 2022-04-04 NOTE — Telephone Encounter (Signed)
Phoned the pt and advised of the note. Pt is agreeing to be scheduled. In the mean time pt advised if the pain continues to get worse to go to the ED to be evaluated. Pt expressed understanding.

## 2022-04-07 ENCOUNTER — Encounter (HOSPITAL_COMMUNITY)
Admission: RE | Admit: 2022-04-07 | Discharge: 2022-04-07 | Disposition: A | Discharge status: Home / Self Care | Payer: 59 | Source: Ambulatory Visit | Attending: Gastroenterology | Admitting: Gastroenterology

## 2022-04-07 ENCOUNTER — Other Ambulatory Visit: Payer: Self-pay

## 2022-04-07 ENCOUNTER — Encounter (HOSPITAL_COMMUNITY): Payer: Self-pay

## 2022-04-07 NOTE — Telephone Encounter (Signed)
Patient called and scheduled for 3/5, 815am. Instructions discussed with pt on phone for prep

## 2022-04-07 NOTE — Telephone Encounter (Signed)
PA approved authorization [id number] DOS Apr 07, 2022 - Jul 06, 2022. Explained instructions to patient while on the phone with her.

## 2022-04-07 NOTE — Pre-Procedure Instructions (Signed)
Pre-op phone call done. When I asked patient who was driving her home, she was going to drive herself home. I explained why she could not drive herself and she states, "I'll find somebody".

## 2022-04-08 ENCOUNTER — Ambulatory Visit (HOSPITAL_COMMUNITY)
Admission: RE | Admit: 2022-04-08 | Discharge: 2022-04-08 | Disposition: A | Discharge status: Home / Self Care | Payer: 59 | Source: Ambulatory Visit | Attending: Gastroenterology | Admitting: Gastroenterology

## 2022-04-08 ENCOUNTER — Ambulatory Visit (HOSPITAL_BASED_OUTPATIENT_CLINIC_OR_DEPARTMENT_OTHER): Payer: 59 | Admitting: Registered Nurse

## 2022-04-08 ENCOUNTER — Encounter (HOSPITAL_COMMUNITY): Payer: Self-pay | Admitting: Gastroenterology

## 2022-04-08 ENCOUNTER — Ambulatory Visit (HOSPITAL_COMMUNITY): Payer: 59 | Admitting: Registered Nurse

## 2022-04-08 ENCOUNTER — Encounter (HOSPITAL_COMMUNITY)
Admission: RE | Disposition: A | Discharge status: Home / Self Care | Payer: Self-pay | Source: Ambulatory Visit | Attending: Gastroenterology

## 2022-04-08 DIAGNOSIS — K5730 Diverticulosis of large intestine without perforation or abscess without bleeding: Secondary | ICD-10-CM | POA: Diagnosis not present

## 2022-04-08 DIAGNOSIS — K648 Other hemorrhoids: Secondary | ICD-10-CM | POA: Diagnosis not present

## 2022-04-08 DIAGNOSIS — G8929 Other chronic pain: Secondary | ICD-10-CM | POA: Diagnosis not present

## 2022-04-08 DIAGNOSIS — I10 Essential (primary) hypertension: Secondary | ICD-10-CM | POA: Insufficient documentation

## 2022-04-08 DIAGNOSIS — K5790 Diverticulosis of intestine, part unspecified, without perforation or abscess without bleeding: Secondary | ICD-10-CM | POA: Diagnosis not present

## 2022-04-08 DIAGNOSIS — K649 Unspecified hemorrhoids: Secondary | ICD-10-CM

## 2022-04-08 DIAGNOSIS — F17210 Nicotine dependence, cigarettes, uncomplicated: Secondary | ICD-10-CM | POA: Insufficient documentation

## 2022-04-08 DIAGNOSIS — R109 Unspecified abdominal pain: Secondary | ICD-10-CM | POA: Insufficient documentation

## 2022-04-08 DIAGNOSIS — K6289 Other specified diseases of anus and rectum: Secondary | ICD-10-CM | POA: Diagnosis present

## 2022-04-08 DIAGNOSIS — K594 Anal spasm: Secondary | ICD-10-CM | POA: Insufficient documentation

## 2022-04-08 HISTORY — PX: FLEXIBLE SIGMOIDOSCOPY: SHX5431

## 2022-04-08 SURGERY — SIGMOIDOSCOPY, FLEXIBLE
Anesthesia: General

## 2022-04-08 MED ORDER — PROPOFOL 10 MG/ML IV BOLUS
INTRAVENOUS | Status: DC | PRN
  Administered 2022-04-08: 20 mg via INTRAVENOUS
  Administered 2022-04-08: 80 mg via INTRAVENOUS

## 2022-04-08 MED ORDER — LACTATED RINGERS IV SOLN: INTRAVENOUS | Status: DC

## 2022-04-08 MED ORDER — LIDOCAINE HCL (CARDIAC) PF 100 MG/5ML IV SOSY
PREFILLED_SYRINGE | INTRAVENOUS | Status: DC | PRN
  Administered 2022-04-08: 50 mg via INTRAVENOUS

## 2022-04-08 MED ORDER — AMITRIPTYLINE HCL 10 MG PO TABS: 10.0000 mg | ORAL_TABLET | Freq: Every day | ORAL | 1 refills | Status: DC

## 2022-04-08 NOTE — Op Note (Signed)
[facility] Patient Name: Christina Erickson Procedure Date: 04/08/2022 8:28 AM MRN: [id number] Date of Birth: 12/02/1950 Attending MD: Maylon Peppers , , [id number] CSN: [id number] Age: 72 Admit Type: Outpatient Procedure:                Flexible Sigmoidoscopy Indications:              Rectal pain Providers:                Maylon Peppers, Crystal Page, Raphael Gibney,                            Technician Referring MD:              Medicines:                Monitored Anesthesia Care Complications:            No immediate complications. Estimated Blood Loss:     Estimated blood loss: none. Procedure:                Pre-Anesthesia Assessment:                           - Prior to the procedure, a History and Physical                            was performed, and patient medications, allergies                            and sensitivities were reviewed. The patient's                            tolerance of previous anesthesia was reviewed.                           - The risks and benefits of the procedure and the                            sedation options and risks were discussed with the                            patient. All questions were answered and informed                            consent was obtained.                           - ASA Grade Assessment: II - A patient with mild                            systemic disease.                           After obtaining informed consent, the scope was                            passed under direct vision. The PCF-HQ190L                            (  [id number]) scope was introduced through the anus and                            advanced to the the sigmoid colon. The flexible                            sigmoidoscopy was accomplished without difficulty.                            The patient tolerated the procedure well. The                            quality of the bowel preparation was poor. Scope In: 8:38:23 AM Scope Out: 8:42:10  AM Total Procedure Duration: 0 hours 3 minutes 47 seconds  Findings:      The perianal and digital rectal examinations were normal.      A large amount of solid stool was found in the sigmoid colon,       interfering with visualization. However, inspection of the colon disd       not show any inflammation or masses.      Scattered small-mouthed diverticula were found in the sigmoid colon.      Non-bleeding internal hemorrhoids were found during retroflexion. The       hemorrhoids were small. Impression:               - Preparation of the colon was poor.                           - Stool in the sigmoid colon.                           - Diverticulosis in the sigmoid colon.                           - Non-bleeding internal hemorrhoids.                           - No specimens collected. Moderate Sedation:      Per Anesthesia Care Recommendation:           - Discharge patient to home (ambulatory).                           - Resume previous diet.                           - Start Elavil 10 mg at bedtime for proctalgia. Procedure Code(s):        --- Professional ---                           [id number], Sigmoidoscopy, flexible; diagnostic,                            including collection of specimen(s) by brushing or                            washing, when performed (  separate procedure) Diagnosis Code(s):        --- Professional ---                           K64.8, Other hemorrhoids                           K62.89, Other specified diseases of anus and rectum                           K57.30, Diverticulosis of large intestine without                            perforation or abscess without bleeding CPT copyright 2022 American Medical Association. All rights reserved. The codes documented in this report are preliminary and upon coder review may  be revised to meet current compliance requirements. Maylon Peppers, MD Maylon Peppers,  04/08/2022 8:46:58 AM This report has been signed  electronically. Number of Addenda: 0

## 2022-04-08 NOTE — Anesthesia Preprocedure Evaluation (Signed)
Anesthesia Evaluation  Patient identified by MRN, date of birth, ID band Patient awake    Reviewed: Allergy & Precautions, H&P , NPO status , Patient's Chart, lab work & pertinent test results, reviewed documented beta blocker date and time   Airway Mallampati: II  TM Distance: >3 FB Neck ROM: full    Dental no notable dental hx.    Pulmonary neg pulmonary ROS, Current Smoker and Patient abstained from smoking.   Pulmonary exam normal breath sounds clear to auscultation       Cardiovascular Exercise Tolerance: Good hypertension, negative cardio ROS  Rhythm:regular Rate:Normal     Neuro/Psych  Neuromuscular disease negative neurological ROS  negative psych ROS   GI/Hepatic negative GI ROS, Neg liver ROS,,,  Endo/Other  negative endocrine ROSdiabetes    Renal/GU negative Renal ROS  negative genitourinary   Musculoskeletal   Abdominal   Peds  Hematology negative hematology ROS (+)   Anesthesia Other Findings   Reproductive/Obstetrics negative OB ROS                             Anesthesia Physical Anesthesia Plan  ASA: 2  Anesthesia Plan: General   Post-op Pain Management:    Induction:   PONV Risk Score and Plan: Propofol infusion  Airway Management Planned:   Additional Equipment:   Intra-op Plan:   Post-operative Plan:   Informed Consent: I have reviewed the patients History and Physical, chart, labs and discussed the procedure including the risks, benefits and alternatives for the proposed anesthesia with the patient or authorized representative who has indicated his/her understanding and acceptance.     Dental Advisory Given  Plan Discussed with: CRNA  Anesthesia Plan Comments:        Anesthesia Quick Evaluation

## 2022-04-08 NOTE — Transfer of Care (Signed)
Immediate Anesthesia Transfer of Care Note  Patient: Christina Erickson  Procedure(s) Performed: FLEXIBLE SIGMOIDOSCOPY  Patient Location: Short Stay  Anesthesia Type:General  Level of Consciousness: awake  Airway & Oxygen Therapy: Patient Spontanous Breathing  Post-op Assessment: Report given to RN and Post -op Vital signs reviewed and stable  Post vital signs: Reviewed and stable  Last Vitals:  Vitals Value Taken Time  BP 110/51 04/08/22 0846  Temp 36.6 C 04/08/22 0846  Pulse 89 04/08/22 0846  Resp 12 04/08/22 0846  SpO2 96 % 04/08/22 0846    Last Pain:  Vitals:   04/08/22 0846  TempSrc: Oral  PainSc: 0-No pain      Patients Stated Pain Goal: 5 ([id number])  Complications: No notable events documented.

## 2022-04-08 NOTE — Discharge Instructions (Signed)
You are being discharged to home.  Resume your previous diet.  Start Elavil 10 mg at bedtime for proctalgia.

## 2022-04-08 NOTE — Interval H&P Note (Signed)
History and Physical Interval Note:  04/08/2022 8:22 AM  Christina Erickson  has presented today for surgery, with the diagnosis of rectal pain.  The various methods of treatment have been discussed with the patient and family. After consideration of risks, benefits and other options for treatment, the patient has consented to  Procedure(s) with comments: FLEXIBLE SIGMOIDOSCOPY (N/A) - asa 1-2 as a surgical intervention.  The patient's history has been reviewed, patient examined, no change in status, stable for surgery.  I have reviewed the patient's chart and labs.  Questions were answered to the patient's satisfaction.     Maylon Peppers [place]

## 2022-04-09 NOTE — Anesthesia Postprocedure Evaluation (Signed)
Anesthesia Post Note  Patient: Christina Erickson  Procedure(s) Performed: [place]  Patient location during evaluation: Phase II Anesthesia Type: General Level of consciousness: awake Pain management: pain level controlled Vital Signs Assessment: post-procedure vital signs reviewed and stable Respiratory status: spontaneous breathing and respiratory function stable Cardiovascular status: blood pressure returned to baseline and stable Postop Assessment: no headache and no apparent nausea or vomiting Anesthetic complications: no Comments: Late entry   No notable events documented.   Last Vitals:  Vitals:   04/08/22 0802 04/08/22 0846  BP: 128/62 (!) 110/51  Pulse: 62 89  Resp: 15 12  Temp: 36.9 C 36.6 C  SpO2: 99% 96%    Last Pain:  Vitals:   04/08/22 0846  TempSrc: Oral  PainSc: 0-No pain                 Louann Sjogren

## 2022-04-14 ENCOUNTER — Encounter (INDEPENDENT_AMBULATORY_CARE_PROVIDER_SITE_OTHER): Payer: Self-pay | Admitting: Gastroenterology

## 2022-04-14 ENCOUNTER — Ambulatory Visit (INDEPENDENT_AMBULATORY_CARE_PROVIDER_SITE_OTHER): Payer: 59 | Admitting: Gastroenterology

## 2022-04-14 VITALS — BP 133/74 | HR 83 | Temp 97.8°F | Ht 63.0 in | Wt 141.2 lb

## 2022-04-14 DIAGNOSIS — K6289 Other specified diseases of anus and rectum: Secondary | ICD-10-CM | POA: Diagnosis not present

## 2022-04-14 DIAGNOSIS — K648 Other hemorrhoids: Secondary | ICD-10-CM | POA: Diagnosis not present

## 2022-04-14 DIAGNOSIS — K640 First degree hemorrhoids: Secondary | ICD-10-CM | POA: Insufficient documentation

## 2022-04-14 NOTE — Progress Notes (Signed)
Maylon Peppers, M.D. Gastroenterology & Hepatology [facility] [address] [place], [place] [address]  Primary Care Physician: Coolidge Breeze, FNP [place] [address] [place] [address]  I will communicate my assessment and recommendations to the referring MD via EMR.  Problems: Chronic rectal discomfort Small internal hemorrhoids  History of Present Illness: Christina Erickson is a 73 y.o. female with past medical history of back pain, hyperlipidemia, hypertension, prediabetes, who presents for follow up of rectal discomfort.  The patient was last seen on 03/20/2022. At that time, the patient was given a compound cream for possible external future.  Given persistent symptoms, she underwent a flexible sigmoidoscopy as described below.   Underwent repeat flexible sigmoidoscopy on 04/08/2022 that showed a large amount of stool in the sigmoid colon but no presence of inflammation or masses.  Diverticulosis.  Internal hemorrhoids were found.  No abnormalities were found in the rectum.  Perianal exam was within normal limits.  Patient was started on Elavil 10 mg at bedtime.  Patient reports that she has presented persistent discomfort in the perianal area when she is sitting or when she has been laying down. She reports the pain goes away when she is standing. Patient describes her pain radiates from the anal area to the "throat and the eyes" when it is severe. She feels constant pressure in the rectum.  She reports she feels the rectal discomfort improves after she moves her bowels. She is having 2 Bms per day. Stools can be between soft and hard. Notably, she was started on Questran for hyperlipidemia by her PCP. She has had some mild nausea in the past but it gets better eventually. The patient denies having any nausea, vomiting, fever, chills, hematochezia, melena, hematemesis, abdominal distention, abdominal pain, diarrhea, jaundice, pruritus or weight  loss.  Most recent CT abdomen and pelvis with IV contrast on 11/2021 did not show any intraabdominal/pelvic pathologies  Last Colonoscopy: 03/22/2021, diverticulosis in the sigmoid colon.  Advised to repeat colonoscopy in 10 years.  Past Medical History: Past Medical History:  Diagnosis Date   Arthritis    Back pain 06/25/2017   High blood pressure    High cholesterol    Pre-diabetes     Past Surgical History: Past Surgical History:  Procedure Laterality Date   BREAST LUMPECTOMY WITH RADIOACTIVE SEED LOCALIZATION Left 08/20/2021   Procedure: LEFT BREAST RADIOACTIVE SEED LOCALIZED LUMPECTOMY;  Surgeon: Donnie Mesa, MD;  Location: [facility];  Service: General;  Laterality: Left;  LMA   CESAREAN SECTION     COLONOSCOPY WITH PROPOFOL N/A 03/22/2021   sigmoid diverticulosis and normal rectum and anal verge.   IR FLUORO GUIDED NEEDLE PLC ASPIRATION/INJECTION LOC  06/25/2017   TUBAL LIGATION      Family History: Family History  Problem Relation Age of Onset   Heart disease Other    Arthritis Other    Cancer Other    Diabetes Other     Social History: Social History   Tobacco Use  Smoking Status Every Day   Packs/day: 0.25   Years: 50.00   Total pack years: 12.50   Types: Cigarettes  Smokeless Tobacco Never  Tobacco Comments   encouraged to quit, declined smoking cessation materials   Social History   Substance and Sexual Activity  Alcohol Use No   Social History   Substance and Sexual Activity  Drug Use Not Currently    Allergies: Allergies  Allergen Reactions   Naproxen Nausea Only  Ezetimibe Other (See Comments)    "Pins and needles"   Statins Other (See Comments)    "Pins and needles"     Medications: Current Outpatient Medications  Medication Sig Dispense Refill   acetaminophen (TYLENOL) 650 MG CR tablet Take 650 mg by mouth at bedtime.     albuterol (VENTOLIN HFA) 108 (90 Base) MCG/ACT inhaler Inhale 1-2 puffs into the lungs every 6 (six) hours  as needed for wheezing or shortness of breath.     amitriptyline (ELAVIL) 10 MG tablet Take 1 tablet (10 mg total) by mouth at bedtime. 90 tablet 1   amLODipine (NORVASC) 10 MG tablet Take 10 mg by mouth in the morning.     benazepril (LOTENSIN) 40 MG tablet Take 40 mg by mouth in the morning.     Calcium Carb-Cholecalciferol (CALCIUM 600 + D PO) Take 1 tablet by mouth daily.     Cholecalciferol (VITAMIN D) 50 MCG (2000 UT) tablet Take 2,000 Units by mouth daily.     cholestyramine (QUESTRAN) 4 g packet Take 4 g by mouth daily.     fenofibrate (TRICOR) 48 MG tablet Take 48 mg by mouth daily.     ibuprofen (ADVIL) 800 MG tablet Take 1 tablet (800 mg total) by mouth every 8 (eight) hours as needed. 90 tablet 1   tiZANidine (ZANAFLEX) 4 MG tablet Take 1 tablet (4 mg total) by mouth every 6 (six) hours as needed for muscle spasms. 30 tablet 0   No current facility-administered medications for this visit.    Review of Systems: GENERAL: negative for malaise, night sweats HEENT: No changes in hearing or vision, no nose bleeds or other nasal problems. NECK: Negative for lumps, goiter, pain and significant neck swelling RESPIRATORY: Negative for cough, wheezing CARDIOVASCULAR: Negative for chest pain, leg swelling, palpitations, orthopnea GI: SEE HPI MUSCULOSKELETAL: Negative for joint pain or swelling, back pain, and muscle pain. SKIN: Negative for lesions, rash PSYCH: Negative for sleep disturbance, mood disorder and recent psychosocial stressors. HEMATOLOGY Negative for prolonged bleeding, bruising easily, and swollen nodes. ENDOCRINE: Negative for cold or heat intolerance, polyuria, polydipsia and goiter. NEURO: negative for tremor, gait imbalance, syncope and seizures. The remainder of the review of systems is noncontributory.   Physical Exam: BP 133/74 (BP Location: Left Arm, Patient Position: Sitting, Cuff Size: Normal)   Pulse 83   Temp 97.8 F (36.6 C) (Temporal)   Ht '5\' 3"'$  (1.6 m)    Wt 141 lb 3.2 oz (64 kg)   BMI 25.01 kg/m  GENERAL: The patient is AO x3, in no acute distress. HEENT: Head is normocephalic and atraumatic. EOMI are intact. Mouth is well hydrated and without lesions. NECK: Supple. No masses LUNGS: Clear to auscultation. No presence of rhonchi/wheezing/rales. Adequate chest expansion HEART: RRR, normal s1 and s2. ABDOMEN: Soft, nontender, no guarding, no peritoneal signs, and nondistended. BS +. No masses. RECTAL: deferred as she had recent DRE during flex sig. EXTREMITIES: Without any cyanosis, clubbing, rash, lesions or edema. NEUROLOGIC: AOx3, no focal motor deficit. SKIN: no jaundice, no rashes  Imaging/Labs: as above  I personally reviewed and interpreted the available labs, imaging and endoscopic files.  Impression and Plan: Christina Erickson is a 72 y.o. female with past medical history of back pain, hyperlipidemia, hypertension, prediabetes, who presents for follow up of rectal discomfort.  Patient has presented persistent symptoms chronically despite using multiple topical ointments.  Her most recent endoscopic evaluation did not show any abnormalities.  I suspect that part of  her symptoms are related to a functional etiology, although the sensation of rectal pressure could also be related to her small internal hemorrhoids.  She would like to have this treated with banding, which I find reasonable.  Will refer her for evaluation by Vicente Males again for possible banding.  I discussed in detail that she should continue taking Elavil on a regular basis as this medication takes some time to kick in.  Depending on symptom response, may need to uptitrate the dosage.  Patient presents worsening constipation, may need to start her on MiraLAX as she is currently using cholestyramine for hyperlipidemia.  - Refer to Roseanne Kaufman for hemorrhoidal banding - Continue Elavil 10 mg at bedtime  All questions were answered.      Maylon Peppers, MD Gastroenterology  and Hepatology [facility]

## 2022-04-14 NOTE — Patient Instructions (Signed)
Refer to Christina Erickson for hemorrhoidal banding Continue Elavil 10 mg at bedtime

## 2022-04-18 ENCOUNTER — Encounter (HOSPITAL_COMMUNITY): Payer: Self-pay | Admitting: Gastroenterology

## 2022-04-21 ENCOUNTER — Ambulatory Visit (INDEPENDENT_AMBULATORY_CARE_PROVIDER_SITE_OTHER): Payer: 59 | Admitting: Orthopedic Surgery

## 2022-04-21 DIAGNOSIS — M5136 Other intervertebral disc degeneration, lumbar region: Secondary | ICD-10-CM

## 2022-04-21 MED ORDER — METHOCARBAMOL 500 MG PO TABS: 500.0000 mg | ORAL_TABLET | Freq: Three times a day (TID) | ORAL | 1 refills | Status: DC

## 2022-04-21 NOTE — Patient Instructions (Signed)
Physical therapy has been ordered for you at [facility]. They should call you to schedule, [phone] is the phone number to call, if you want to call to schedule.   

## 2022-04-21 NOTE — Progress Notes (Signed)
FOLLOW-UP OFFICE VISIT   Encounter Diagnosis  Name Primary?   DDD (degenerative disc disease), lumbar Yes    HPI  72 year old female presents in follow-up after diagnosed with degenerative disc disease of the lumbar spine she was treated with ibuprofen and tizanidine and 6 weeks of physical therapy  Her prior history as noted below  Her current symptoms include pain in the lower back with some shoulder pain.  She went to a place near [facility] who recommended she have an MRI of her back but she still has not had the physical therapy  Her initial presentation was as follows Christina Erickson is a 72 y.o. female.  Presents with several months of pain in her lower back.  The pain is bilateral.  It radiates to the knees sometimes and then into the feet at times.  Patient notes she has pain in the supine position as well as problems sitting and driving.  The pain is relieved when she is standing.   She has no history of cancer no saddle anesthesia  Imaging on prior evaluation  IMAGING: Independent interpretation of images: Outside imaging lumbar spine abnormalities found included abnormal alignment in the coronal plane facet arthritis degenerative disc disease      ASSESSMENT AND PLAN  She has not started treatment she is not having any neurologic symptoms recommend Re order the therapy   Change the tizanidine to Robaxin as she says it does not helping Meds ordered this encounter  Medications   methocarbamol (ROBAXIN) 500 MG tablet    Sig: Take 1 tablet (500 mg total) by mouth 3 (three) times daily.    Dispense:  60 tablet    Refill:  1   Call the office after she has had 6 weeks of physical therapy to reschedule an appointment

## 2022-04-23 ENCOUNTER — Encounter: Payer: 59 | Admitting: Gastroenterology

## 2022-04-29 NOTE — Progress Notes (Unsigned)
   [place] Banding Procedure Note:   Christina Erickson is a 72 y.o. female presenting today for consideration of hemorrhoid banding. Last colonoscopy 03/22/21 and Flex sig in March 2024. Flex sigmoidoscopy with poor prep of colon, stool in sigmoid colon, diverticulosis in sigmoid colon, non bleeding internal hemorrhoids. Started on Elavil for proctalgia.  Interval History: Having rectal pressure. Denies any significant rectal bleeding. Continues to take Elavil.   The patient presents with symptomatic grade 1 hemorrhoids, unresponsive to maximal medical therapy, requesting rubber band ligation of his/her hemorrhoidal disease. All risks, benefits, and alternative forms of therapy were described and informed consent was obtained.  In the left lateral decubitus position anoscopic examination revealed grade 1 hemorrhoids in the left lateral and right posterior position(s).  The decision was made to band the left lateral internal hemorrhoid (attempted right posterior however unable to get good suction from the device, and the [place] was used to perform band ligation without complication. Digital anorectal examination was then performed to assure proper positioning of the band, and to adjust the banded tissue as required. The patient was discharged home without pain or other issues. Dietary and behavioral recommendations were given and (if necessary prescriptions were given), along with follow-up instructions. The patient will return in 2-3 weeks for followup and possible additional banding as required.  No complications were encountered and the patient tolerated the procedure well.    Venetia Night, MSN, FNP-BC, AGACNP-BC [facility]

## 2022-04-30 ENCOUNTER — Ambulatory Visit (INDEPENDENT_AMBULATORY_CARE_PROVIDER_SITE_OTHER): Payer: 59 | Admitting: Gastroenterology

## 2022-04-30 ENCOUNTER — Encounter: Payer: 59 | Admitting: Gastroenterology

## 2022-04-30 ENCOUNTER — Encounter: Payer: Self-pay | Admitting: Gastroenterology

## 2022-04-30 VITALS — BP 138/67 | HR 71 | Temp 97.5°F | Ht 62.0 in | Wt 139.8 lb

## 2022-04-30 DIAGNOSIS — K640 First degree hemorrhoids: Secondary | ICD-10-CM | POA: Diagnosis not present

## 2022-04-30 DIAGNOSIS — K648 Other hemorrhoids: Secondary | ICD-10-CM

## 2022-04-30 NOTE — Patient Instructions (Signed)
Avoid straining. Limit toilet time to 2-3 minutes at the most.   Avoid constipation. Take 2 tablespoons of natural wheat bran, natural oat bran, flax, Benefiber or any over the counter fiber supplement and increase your water intake to 7-8 glasses daily.  If you are experiencing any constipation related to your cholestyramine you should begin Miralax 17 g once daily.   Occasionally, you may have more bleeding than usual after the banding procedure. This is often from the untreated hemorrhoids rather than the treated one. Don't be concerned if there is a tablespoon or so of blood. If there is more blood than this, lie flat with your bottom higher than your head and apply an ice pack to the area. If the bleeding does not stop within a half an hour or if you feel faint, have severe pain, chills, fever or difficulty passing urine (very rare) or other problems, you should call us at [phone] or report to the nearest emergency room.call our office at [phone] or go to the emergency room.  Please call me with any concerns!  The procedure you have had should have been relatively painless since the banding of the area involved does not have nerve endings and there is no pain sensation. The rubber band cuts off the blood supply to the hemorrhoid and the band may fall off as soon as 48 hours after the banding (the band may occasionally be seen in the toilet bowl following a bowel movement). You may notice a temporary feeling of fullness in the rectum which should respond adequately to plain Tylenol or Motrin.  I will see you back in 2-3 weeks for follow-up and/or for additional banding.   Venetia Night, MSN, FNP-BC, AGACNP-BC [facility]

## 2022-05-09 ENCOUNTER — Telehealth: Payer: Self-pay

## 2022-05-09 NOTE — Telephone Encounter (Signed)
Returned the pt's call and was advised that she had a banding last week and she is no better.I asked her to explain in more detail. She states when she sits or lay down she can feel pain in her rectum (which she is saying her hemorrhoids). She states when she sits the pain radiates to her neck then to the top of her head. I asked her was she having trouble having a BM she stated no. Her stool softener is helping with that. No bleeding or swelling. Please advise.

## 2022-05-09 NOTE — Telephone Encounter (Signed)
Pt has been advised of banding

## 2022-05-09 NOTE — Telephone Encounter (Signed)
Phoned the pt back and was advised that the cream didn't help before but she will try it again. Pt wanted her banding appt moved up but I told her if she is already in pain this will not be done. Pt advises she will try the cream again

## 2022-05-09 NOTE — Telephone Encounter (Signed)
Phoned the pt and LM with her spouse to return the call when she returns home.

## 2022-05-09 NOTE — Telephone Encounter (Signed)
Noted  [facility] will call pt to make sure she wants it before they make it up. Pt knows to wait until they call her

## 2022-05-13 ENCOUNTER — Ambulatory Visit (INDEPENDENT_AMBULATORY_CARE_PROVIDER_SITE_OTHER): Payer: 59 | Admitting: Gastroenterology

## 2022-05-13 ENCOUNTER — Encounter: Payer: Self-pay | Admitting: Gastroenterology

## 2022-05-13 VITALS — BP 125/67 | HR 80 | Temp 97.3°F | Ht 62.5 in | Wt 137.4 lb

## 2022-05-13 DIAGNOSIS — K640 First degree hemorrhoids: Secondary | ICD-10-CM

## 2022-05-13 NOTE — Patient Instructions (Signed)
  Please avoid straining.  You should limit your toilet time to 2-3 minutes at the most.   I recommend Benefiber 2 teaspoons each morning in the beverage of your choice! You can take Miralax daily as needed for constipation.   Please call me with any concerns or issues!  I will see you in follow-up for additional banding in several weeks.  I enjoyed seeing you again today! At our first visit, I mentioned how I value our relationship and want to provide genuine, compassionate, and quality care. You may receive a survey regarding your visit with me, and I welcome your feedback! Thanks so much for taking the time to complete this. I look forward to seeing you again.   Gelene Mink, PhD, ANP-BC [facility]

## 2022-05-13 NOTE — Progress Notes (Signed)
    [facility] BANDING PROCEDURE NOTE  Christina Erickson is a 72 y.o. female presenting today for consideration of hemorrhoid banding. Last colonoscopy  03/22/21 and Flex sig in March 2024. Flex sigmoidoscopy with poor prep of colon, stool in sigmoid colon, diverticulosis in sigmoid colon, non bleeding internal hemorrhoids. Started on Elavil for proctalgia. She has had left lateral banding.    The patient presents with symptomatic grade 1 hemorrhoids, unresponsive to maximal medical therapy, requesting rubber band ligation of her hemorrhoidal disease. All risks, benefits, and alternative forms of therapy were described and informed consent was obtained.  The decision was made to band neutrally, and the [facility] was used to perform band ligation without complication. Appears to be right anterior position. Digital anorectal examination was then performed to assure proper positioning of the band, and to adjust the banded tissue as required. The patient was discharged home without pain or other issues. Dietary and behavioral recommendations were given, along with follow-up instructions. The patient will return in several weeks for followup and possible additional banding as required.  No complications were encountered and the patient tolerated the procedure well.   Gelene Mink, PhD, ANP-BC [facility]

## 2022-05-14 ENCOUNTER — Ambulatory Visit (HOSPITAL_COMMUNITY): Payer: 59 | Attending: Orthopedic Surgery | Admitting: Physical Therapy

## 2022-05-14 ENCOUNTER — Encounter (HOSPITAL_COMMUNITY): Payer: Self-pay | Admitting: Physical Therapy

## 2022-05-14 DIAGNOSIS — M5459 Other low back pain: Secondary | ICD-10-CM | POA: Insufficient documentation

## 2022-05-14 DIAGNOSIS — M5136 Other intervertebral disc degeneration, lumbar region: Secondary | ICD-10-CM | POA: Insufficient documentation

## 2022-05-14 NOTE — Therapy (Signed)
OUTPATIENT PHYSICAL THERAPY THORACOLUMBAR EVALUATION   Patient Name: Christina Erickson MRN: [id number] DOB:12/22/1950, 72 y.o., female Today's Date: 05/14/2022  END OF SESSION:  PT End of Session - 05/14/22 1020     Visit Number 1    Number of Visits 12    Date for PT Re-Evaluation 06/25/22    Authorization Type UHC dual complete/ Medicaid 2ndary    Progress Note Due on Visit 10    PT Start Time 0948    PT Stop Time 1025    PT Time Calculation (min) 37 min    Activity Tolerance Patient tolerated treatment well    [facility] assessed/performed             Past Medical History:  Diagnosis Date   Arthritis    Back pain 06/25/2017   High blood pressure    High cholesterol    Pre-diabetes    Past Surgical History:  Procedure Laterality Date   BREAST LUMPECTOMY WITH RADIOACTIVE SEED LOCALIZATION Left 08/20/2021   Procedure: LEFT BREAST RADIOACTIVE SEED LOCALIZED LUMPECTOMY;  Surgeon: Manus Ruddsuei, Matthew, MD;  Location: [facility] OR;  Service: General;  Laterality: Left;  LMA   CESAREAN SECTION     COLONOSCOPY WITH PROPOFOL N/A 03/22/2021   sigmoid diverticulosis and normal rectum and anal verge.   FLEXIBLE SIGMOIDOSCOPY N/A 04/08/2022   Procedure: FLEXIBLE SIGMOIDOSCOPY;  Surgeon: Dolores Frameastaneda Mayorga, Daniel, MD;  Location: [facility];  Service: Gastroenterology;  Laterality: N/A;  asa 1-2   IR FLUORO GUIDED NEEDLE PLC ASPIRATION/INJECTION LOC  06/25/2017   TUBAL LIGATION     Patient Active Problem List   Diagnosis Date Noted   Grade I hemorrhoids 04/14/2022   Proctalgia 03/20/2022   Buttock pain 01/08/2022   Allergic rhinitis due to pollen 07/26/2021   Benign essential hypertension 07/26/2021   High cholesterol 07/26/2021   Peripheral neuropathy 07/26/2021   Type 2 diabetes mellitus 07/26/2021   Change in bowel movement 12/06/2020   Abdominal pain 12/06/2020   Patellofemoral arthritis of left knee 03/09/2012    PCP: Coral CeoGina Collins FNP  REFERRING  PROVIDER: Vickki HearingHarrison, Stanley E, MD  REFERRING DIAG: M51.36 (ICD-10-CM) - DDD (degenerative disc disease), lumbar  Rationale for Evaluation and Treatment: Rehabilitation  THERAPY DIAG:  Other low back pain - Plan: PT plan of care cert/re-cert  ONSET DATE: Chronic   SUBJECTIVE:                                                                                                                                                                                           SUBJECTIVE STATEMENT: Patient presents with complaint of back  and shoulder pain. This is chronic. She has has injections and takes prescribed mediation which she say has not helped. Patient also notes she has hemorrhoids and thinks this has something to do with her back pain.   PERTINENT HISTORY:  Chronic LBP, DDD, HTN, DM   PAIN:  Are you having pain? Yes: NPRS scale: 0-1/10 Pain location: low back  Pain description: dull Aggravating factors: bending Relieving factors: rest, meds   PRECAUTIONS: None  WEIGHT BEARING RESTRICTIONS: No  FALLS:  Has patient fallen in last 6 months? No  LIVING ENVIRONMENT: Lives with: lives with an adult companion Lives in: Mobile home Stairs: No Has following equipment at home: None  OCCUPATION: Retired  PLOF: Independent  PATIENT GOALS: "relief"   NEXT MD VISIT: "after therapy"   OBJECTIVE:   DIAGNOSTIC FINDINGS:  IMPRESSION: Degenerative disc and facet disease changes lumbar spine with osseous demineralization.   No acute abnormalities.   Aortic Atherosclerosis (ICD10-I70.0).   PATIENT SURVEYS:  FOTO 61% function   COGNITION: Overall cognitive status: Within functional limits for tasks assessed     PALPATION: Min TTP about bilat lumbar paraspinals   LUMBAR ROM:   AROM eval  Flexion WNL  Extension 50% limited  Right lateral flexion 25% Limited  Left lateral flexion 25% Limited  Right rotation 25% Limited  Left rotation 25% Limited   (Blank rows = not  tested)   LOWER EXTREMITY MMT:    MMT Right eval Left eval  Hip flexion 4 4+  Hip extension 3- 3-  Hip abduction 3+ 4  Hip adduction    Hip internal rotation    Hip external rotation    Knee flexion    Knee extension 4+ 4+  Ankle dorsiflexion 5 5  Ankle plantarflexion    Ankle inversion    Ankle eversion     (Blank rows = not tested)  LUMBAR SPECIAL TESTS:  Straight leg raise test: Negative bilat  FUNCTIONAL TESTS:  2 minute walk test: 460 feet no AD   GAIT: No AD, decreased stride but normal speed, decreased hip extension bilat  TODAY'S TREATMENT:                                                                                                                              DATE:  05/14/22 Eval     PATIENT EDUCATION:  Education details: On Eval findings, POC and HEP  Person educated: Patient Education method: Explanation Education comprehension: verbalized understanding  HOME EXERCISE PROGRAM: Access Code: [email or web address] URL: [email or web address] Date: 05/14/2022 Prepared by: Georges Lynch  Exercises - Supine Lower Trunk Rotation  - 2-3 x daily - 7 x weekly - 1 sets - 10 reps - 5 second hold - Hooklying Gluteal Sets  - 2-3 x daily - 7 x weekly - 1 sets - 10 reps - 5 second hold - Seated Scapular Retraction  - 2-3 x daily - 7 x weekly - 1 sets - 10 reps -  5 second hold  ASSESSMENT:  CLINICAL IMPRESSION: Patient is a 72 y.o. female who presents to physical therapy with complaint of LBP. Patient demonstrates muscle weakness, reduced ROM, and fascial restrictions which are likely contributing to symptoms of pain and are negatively impacting patient ability to perform ADLs and functional mobility tasks. Patient will benefit from skilled physical therapy services to address these deficits to reduce pain and improve level of function with ADLs and functional mobility tasks.   OBJECTIVE IMPAIRMENTS: Abnormal gait, decreased activity tolerance, decreased  balance, decreased mobility, difficulty walking, decreased ROM, decreased strength, increased fascial restrictions, impaired flexibility, improper body mechanics, and pain.   ACTIVITY LIMITATIONS: carrying, lifting, bending, sitting, standing, squatting, sleeping, stairs, transfers, bed mobility, and locomotion level  PARTICIPATION LIMITATIONS: meal prep, cleaning, laundry, driving, shopping, community activity, and yard work  PERSONAL FACTORS: Behavior pattern, Past/current experiences, and Time since onset of injury/illness/exacerbation are also affecting patient's functional outcome.   REHAB POTENTIAL: Fair See above  CLINICAL DECISION MAKING: Stable/uncomplicated  EVALUATION COMPLEXITY: Low   GOALS: SHORT TERM GOALS: Target date: 06/04/2022  Patient will be independent with initial HEP and self-management strategies to improve functional outcomes Baseline:  Goal status: INITIAL   LONG TERM GOALS: Target date: 06/25/2022  Patient will be independent with advanced HEP and self-management strategies to improve functional outcomes Baseline:  Goal status: INITIAL  2.  Patient will improve FOTO score to predicted value to indicate improvement in functional outcomes Baseline: 61% function  Goal status: INITIAL  3.  Patient will report at least 50% improvement in subjective complaint to indicate improvement in functional outcomes Baseline:  Goal status: INITIAL  4. Patient will have equal to or > 4+/5 MMT throughout BLE to improve ability to perform functional mobility, stair ambulation and ADLs.  Baseline: See MMT Goal status: INITIAL  PLAN:  PT FREQUENCY: 2x/week  PT DURATION: 6 weeks  PLANNED INTERVENTIONS: Therapeutic exercises, Therapeutic activity, Neuromuscular re-education, Balance training, Gait training, Patient/Family education, Joint manipulation, Joint mobilization, Stair training, Aquatic Therapy, Dry Needling, Electrical stimulation, Spinal manipulation, Spinal  mobilization, Cryotherapy, Moist heat, scar mobilization, Taping, Traction, Ultrasound, Biofeedback, Ionotophoresis 4mg /ml Dexamethasone, and Manual therapy. Marland Kitchen  PLAN FOR NEXT SESSION: Progress hip, core and postural strengthening as tolerated. OT referral if needed for shoulder pain   10:23 AM, 05/14/22 Georges Lynch PT DPT  Physical Therapist with [facility]  [phone]

## 2022-05-20 ENCOUNTER — Ambulatory Visit (HOSPITAL_COMMUNITY): Payer: 59

## 2022-05-20 DIAGNOSIS — M5459 Other low back pain: Secondary | ICD-10-CM

## 2022-05-20 NOTE — Therapy (Signed)
OUTPATIENT PHYSICAL THERAPY THORACOLUMBAR TREATMENT   Patient Name: Christina Erickson MRN: [id number] DOB:10/17/1950, 72 y.o., female Today's Date: 05/20/2022  END OF SESSION:  PT End of Session - 05/20/22 1033     Visit Number 2    Number of Visits 12    Date for PT Re-Evaluation 06/25/22    Authorization Type UHC dual complete/ Medicaid 2ndary    Progress Note Due on Visit 10    PT Start Time 1032    PT Stop Time 1112    PT Time Calculation (min) 40 min    Activity Tolerance Patient tolerated treatment well    Behavior During Therapy WFL for tasks assessed/performed             Past Medical History:  Diagnosis Date   Arthritis    Back pain 06/25/2017   High blood pressure    High cholesterol    Pre-diabetes    Past Surgical History:  Procedure Laterality Date   BREAST LUMPECTOMY WITH RADIOACTIVE SEED LOCALIZATION Left 08/20/2021   Procedure: LEFT BREAST RADIOACTIVE SEED LOCALIZED LUMPECTOMY;  Surgeon: Manus Rudd, MD;  Location: [facility] OR;  Service: General;  Laterality: Left;  LMA   CESAREAN SECTION     COLONOSCOPY WITH PROPOFOL N/A 03/22/2021   sigmoid diverticulosis and normal rectum and anal verge.   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   Buttock pain 01/08/2022   Allergic rhinitis due to pollen 07/26/2021   Benign essential hypertension 07/26/2021   High cholesterol 07/26/2021   Peripheral neuropathy 07/26/2021   Type 2 diabetes mellitus 07/26/2021   Change in bowel movement 12/06/2020   Abdominal pain 12/06/2020   Patellofemoral arthritis of left knee 03/09/2012    PCP: Coral Ceo FNP  REFERRING  PROVIDER: Vickki Hearing, MD  REFERRING DIAG: M51.36 (ICD-10-CM) - DDD (degenerative disc disease), lumbar  Rationale for Evaluation and Treatment: Rehabilitation  THERAPY DIAG:  Other low back pain  ONSET DATE: Chronic   SUBJECTIVE:                                                                                                                                                                                           SUBJECTIVE STATEMENT: "Maybe a little better"; having another Hemorrhoid banding May 2nd;  has a  hard time getting comfortable to sleep soundly.   EVAL:Patient presents with complaint of back and shoulder pain. This is chronic. She has has injections and takes prescribed mediation which she say has not helped. Patient also notes she has hemorrhoids and thinks this has something to do with her back pain.   PERTINENT HISTORY:  Chronic LBP, DDD, HTN, DM   PAIN:  Are you having pain? Yes: NPRS scale: 0-1/10 Pain location: low back  Pain description: dull Aggravating factors: bending Relieving factors: rest, meds   PRECAUTIONS: None  WEIGHT BEARING RESTRICTIONS: No  FALLS:  Has patient fallen in last 6 months? No  LIVING ENVIRONMENT: Lives with: lives with an adult companion Lives in: Mobile home Stairs: No Has following equipment at home: None  OCCUPATION: Retired  PLOF: Independent  PATIENT GOALS: "relief"   NEXT MD VISIT: "after therapy"   OBJECTIVE:   DIAGNOSTIC FINDINGS:  IMPRESSION: Degenerative disc and facet disease changes lumbar spine with osseous demineralization.   No acute abnormalities.   Aortic Atherosclerosis (ICD10-I70.0).   PATIENT SURVEYS:  FOTO 61% function   COGNITION: Overall cognitive status: Within functional limits for tasks assessed     PALPATION: Min TTP about bilat lumbar paraspinals   LUMBAR ROM:   AROM eval  Flexion WNL  Extension 50% limited  Right lateral flexion 25% Limited  Left lateral flexion 25%  Limited  Right rotation 25% Limited  Left rotation 25% Limited   (Blank rows = not tested)   LOWER EXTREMITY MMT:    MMT Right eval Left eval  Hip flexion 4 4+  Hip extension 3- 3-  Hip abduction 3+ 4  Hip adduction    Hip internal rotation    Hip external rotation    Knee flexion    Knee extension 4+ 4+  Ankle dorsiflexion 5 5  Ankle plantarflexion    Ankle inversion    Ankle eversion     (Blank rows = not tested)  LUMBAR SPECIAL TESTS:  Straight leg raise test: Negative bilat  FUNCTIONAL TESTS:  2 minute walk test: 460 feet no AD   GAIT: No AD, decreased stride but normal speed, decreased hip extension bilat  TODAY'S TREATMENT:                                                                                                                              DATE:  05/20/22 Review of HEP and goals Seated: Scapular retractions x 10  Supine: LTR x 10 Glute sets 5" hold x 10 Bridge x 10 Abdominal bracing 5" hold x 10 Abdominal bracing with marching 2 x 10 SKTC 20" hold x 3 Active hamstring stretch 3 x 20"   05/14/22 Eval     PATIENT EDUCATION:  Education details: On Eval findings, POC and HEP  Person educated: Patient Education method: Explanation Education comprehension: verbalized understanding  HOME EXERCISE PROGRAM: Access Code: [email or web address] URL: [email or web address] Date: 05/20/2022 Prepared by: [facility]  Exercises -  Supine Lower Trunk Rotation  - 2-3 x daily - 7 x weekly - 1 sets - 10 reps - 5 second hold - Hooklying Gluteal Sets  - 2-3 x daily - 7 x weekly - 1 sets - 10 reps - 5 second hold - Seated Scapular Retraction  - 2-3 x daily - 7 x weekly - 1 sets - 10 reps - 5 second hold - Supine Bridge  - 2-3 x daily - 7 x weekly - 1 sets - 10 reps - Supine Transversus Abdominis Bracing - Hands on Stomach  - 2-3 x daily - 7 x weekly - 1 sets - 10 reps - Supine March  - 2-3 x daily - 7 x weekly - 2 sets - 10 reps - Hooklying Single Knee to  Chest Stretch  - 2-3 x daily - 7 x weekly - 1 sets - 3 reps - 20" hold - Supine Hamstring Stretch  - 2-3 x daily - 7 x weekly - 1 sets - 3 reps - 20 sec hold Access Code: [email or web address] URL: [email or web address] Date: 05/14/2022 Prepared by: Georges Andera Cranmer  Exercises - Supine Lower Trunk Rotation  - 2-3 x daily - 7 x weekly - 1 sets - 10 reps - 5 second hold - Hooklying Gluteal Sets  - 2-3 x daily - 7 x weekly - 1 sets - 10 reps - 5 second hold - Seated Scapular Retraction  - 2-3 x daily - 7 x weekly - 1 sets - 10 reps - 5 second hold  ASSESSMENT:  CLINICAL IMPRESSION: Today's session started with a review of HEP and goals. Patient verbalizes agreement with set rehab goals.  Needs cues to perform scapular retractions correctly today.  Patient with a hard time getting comfortable in any position.  Patient with noted significant tightness in bilateral hamstrings today.  Updated HEP.  Patient will benefit from continued skilled therapy servicesto address deficits and promote return to optimal function.      Eval:Patient is a 72 y.o. female who presents to physical therapy with complaint of LBP. Patient demonstrates muscle weakness, reduced ROM, and fascial restrictions which are likely contributing to symptoms of pain and are negatively impacting patient ability to perform ADLs and functional mobility tasks. Patient will benefit from skilled physical therapy services to address these deficits to reduce pain and improve level of function with ADLs and functional mobility tasks.   OBJECTIVE IMPAIRMENTS: Abnormal gait, decreased activity tolerance, decreased balance, decreased mobility, difficulty walking, decreased ROM, decreased strength, increased fascial restrictions, impaired flexibility, improper body mechanics, and pain.   ACTIVITY LIMITATIONS: carrying, lifting, bending, sitting, standing, squatting, sleeping, stairs, transfers, bed mobility, and locomotion level  PARTICIPATION  LIMITATIONS: meal prep, cleaning, laundry, driving, shopping, community activity, and yard work  PERSONAL FACTORS: Behavior pattern, Past/current experiences, and Time since onset of injury/illness/exacerbation are also affecting patient's functional outcome.   REHAB POTENTIAL: Fair See above  CLINICAL DECISION MAKING: Stable/uncomplicated  EVALUATION COMPLEXITY: Low   GOALS: SHORT TERM GOALS: Target date: 06/04/2022  Patient will be independent with initial HEP and self-management strategies to improve functional outcomes Baseline:  Goal status: IN PROGRESS   LONG TERM GOALS: Target date: 06/25/2022  Patient will be independent with advanced HEP and self-management strategies to improve functional outcomes Baseline:  Goal status: IN PROGRESS  2.  Patient will improve FOTO score to predicted value to indicate improvement in functional outcomes Baseline: 61% function  Goal status: IN PROGRESS  3.  Patient will report at least 50% improvement  in subjective complaint to indicate improvement in functional outcomes Baseline:  Goal status: IN PROGRESS  4. Patient will have equal to or > 4+/5 MMT throughout BLE to improve ability to perform functional mobility, stair ambulation and ADLs.  Baseline: See MMT Goal status: IN PROGRESS  PLAN:  PT FREQUENCY: 2x/week  PT DURATION: 6 weeks  PLANNED INTERVENTIONS: Therapeutic exercises, Therapeutic activity, Neuromuscular re-education, Balance training, Gait training, Patient/Family education, Joint manipulation, Joint mobilization, Stair training, Aquatic Therapy, Dry Needling, Electrical stimulation, Spinal manipulation, Spinal mobilization, Cryotherapy, Moist heat, scar mobilization, Taping, Traction, Ultrasound, Biofeedback, Ionotophoresis /ml Dexamethasone, and Manual therapy. Marland Kitchen  PLAN FOR NEXT SESSION: Progress hip, core and postural strengthening as tolerated. OT referral if needed for shoulder pain   11:14 AM, 05/20/22 Bless Lisenby  Small Briani Maul MPT [facility] [phone]

## 2022-05-22 ENCOUNTER — Encounter: Payer: 59 | Admitting: Gastroenterology

## 2022-05-23 ENCOUNTER — Encounter (HOSPITAL_COMMUNITY): Payer: Self-pay

## 2022-05-23 ENCOUNTER — Ambulatory Visit (HOSPITAL_COMMUNITY): Payer: 59

## 2022-05-23 DIAGNOSIS — M5459 Other low back pain: Secondary | ICD-10-CM

## 2022-05-23 NOTE — Therapy (Signed)
OUTPATIENT PHYSICAL THERAPY THORACOLUMBAR TREATMENT   Patient Name: Christina Erickson MRN: [id number] DOB:15-Mar-1950, 72 y.o., female Today's Date: 05/23/2022  END OF SESSION:  PT End of Session - 05/23/22 1126     Visit Number 3    Number of Visits 12    Date for PT Re-Evaluation 06/25/22    Authorization Type UHC dual complete/ Medicaid 2ndary    Progress Note Due on Visit 10    PT Start Time 1047    PT Stop Time 1125    PT Time Calculation (min) 38 min    Activity Tolerance Patient tolerated treatment well    Behavior During Therapy WFL for tasks assessed/performed              Past Medical History:  Diagnosis Date   Arthritis    Back pain 06/25/2017   High blood pressure    High cholesterol    Pre-diabetes    Past Surgical History:  Procedure Laterality Date   BREAST LUMPECTOMY WITH RADIOACTIVE SEED LOCALIZATION Left 08/20/2021   Procedure: LEFT BREAST RADIOACTIVE SEED LOCALIZED LUMPECTOMY;  Surgeon: Manus Rudd, MD;  Location: [facility];  Service: General;  Laterality: Left;  LMA   CESAREAN SECTION     COLONOSCOPY WITH PROPOFOL N/A 03/22/2021   sigmoid diverticulosis and normal rectum and anal verge.   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   Buttock pain 01/08/2022   Allergic rhinitis due to pollen 07/26/2021   Benign essential hypertension 07/26/2021   High cholesterol 07/26/2021   Peripheral neuropathy 07/26/2021   Type 2 diabetes mellitus 07/26/2021   Change in bowel movement 12/06/2020   Abdominal pain 12/06/2020   Patellofemoral arthritis of left knee 03/09/2012    PCP: Coral Ceo  FNP  REFERRING PROVIDER: Vickki Hearing, MD  REFERRING DIAG: M51.36 (ICD-10-CM) - DDD (degenerative disc disease), lumbar  Rationale for Evaluation and Treatment: Rehabilitation  THERAPY DIAG:  Other low back pain  ONSET DATE: Chronic   SUBJECTIVE:                                                                                                                                                                                           SUBJECTIVE STATEMENT: Reports difficulty sitting or laying on back due to the hemorrhoids, banding  scheduled for May 2nd.  Has began some of the exercises at home without question.  Reports difficulty sleeping last night, hard to get comfortable  EVAL:Patient presents with complaint of back and shoulder pain. This is chronic. She has has injections and takes prescribed mediation which she say has not helped. Patient also notes she has hemorrhoids and thinks this has something to do with her back pain.   PERTINENT HISTORY:  Chronic LBP, DDD, HTN, DM   PAIN:  Are you having pain? Yes: NPRS scale: 0-1/10 Pain location: low back  Pain description: dull Aggravating factors: bending Relieving factors: rest, meds   PRECAUTIONS: None  WEIGHT BEARING RESTRICTIONS: No  FALLS:  Has patient fallen in last 6 months? No  LIVING ENVIRONMENT: Lives with: lives with an adult companion Lives in: Mobile home Stairs: No Has following equipment at home: None  OCCUPATION: Retired  PLOF: Independent  PATIENT GOALS: "relief"   NEXT MD VISIT: "after therapy"   OBJECTIVE:   DIAGNOSTIC FINDINGS:  IMPRESSION: Degenerative disc and facet disease changes lumbar spine with osseous demineralization.   No acute abnormalities.   Aortic Atherosclerosis (ICD10-I70.0).   PATIENT SURVEYS:  FOTO 61% function   COGNITION: Overall cognitive status: Within functional limits for tasks assessed     PALPATION: Min TTP about bilat lumbar paraspinals   LUMBAR  ROM:   AROM eval  Flexion WNL  Extension 50% limited  Right lateral flexion 25% Limited  Left lateral flexion 25% Limited  Right rotation 25% Limited  Left rotation 25% Limited   (Blank rows = not tested)   LOWER EXTREMITY MMT:    MMT Right eval Left eval  Hip flexion 4 4+  Hip extension 3- 3-  Hip abduction 3+ 4  Hip adduction    Hip internal rotation    Hip external rotation    Knee flexion    Knee extension 4+ 4+  Ankle dorsiflexion 5 5  Ankle plantarflexion    Ankle inversion    Ankle eversion     (Blank rows = not tested)  LUMBAR SPECIAL TESTS:  Straight leg raise test: Negative bilat  FUNCTIONAL TESTS:  2 minute walk test: 460 feet no AD   GAIT: No AD, decreased stride but normal speed, decreased hip extension bilat  TODAY'S TREATMENT:                                                                                                                              DATE:  05/23/22: Sidelying clam 2x 10  Supine: Bridge 15x Hamstring stretch supine with hands behind knee 2x30"  Prone: POE x Hip extension Press up 5x 10"  Standing: scapular retraction rows 10x with RTB, verbal and tactile cueing  05/20/22 Review of HEP and goals Seated: Scapular retractions x 10  Supine: LTR x 10 Glute sets 5" hold x 10 Bridge x 10 Abdominal bracing 5" hold x 10 Abdominal bracing with marching 2 x 10 SKTC  20" hold x 3 Active hamstring stretch 3 x 20"   05/14/22 Eval     PATIENT EDUCATION:  Education details: On Eval findings, POC and HEP  Person educated: Patient Education method: Explanation Education comprehension: verbalized understanding  HOME EXERCISE PROGRAM: Access Code: [email or web address] URL: [email or web address] Date: 05/20/2022 Prepared by: [facility]  Exercises - Supine Lower Trunk Rotation  - 2-3 x daily - 7 x weekly - 1 sets - 10 reps - 5 second hold - Hooklying Gluteal Sets  - 2-3 x daily - 7 x weekly - 1 sets - 10 reps - 5  second hold - Seated Scapular Retraction  - 2-3 x daily - 7 x weekly - 1 sets - 10 reps - 5 second hold - Supine Bridge  - 2-3 x daily - 7 x weekly - 1 sets - 10 reps - Supine Transversus Abdominis Bracing - Hands on Stomach  - 2-3 x daily - 7 x weekly - 1 sets - 10 reps - Supine March  - 2-3 x daily - 7 x weekly - 2 sets - 10 reps - Hooklying Single Knee to Chest Stretch  - 2-3 x daily - 7 x weekly - 1 sets - 3 reps - 20" hold - Supine Hamstring Stretch  - 2-3 x daily - 7 x weekly - 1 sets - 3 reps - 20 sec hold Access Code: [email or web address] URL: [email or web address] Date: 05/14/2022 Prepared by: Georges Lynch  Exercises - Supine Lower Trunk Rotation  - 2-3 x daily - 7 x weekly - 1 sets - 10 reps - 5 second hold - Hooklying Gluteal Sets  - 2-3 x daily - 7 x weekly - 1 sets - 10 reps - 5 second hold - Seated Scapular Retraction  - 2-3 x daily - 7 x weekly - 1 sets - 10 reps - 5 second hold  ASSESSMENT:  CLINICAL IMPRESSION: Today's session started with a review of HEP and goals. Patient verbalizes agreement with set rehab goals.  Needs cues to perform scapular retractions correctly today.  Patient with a hard time getting comfortable in any position.  Patient with noted significant tightness in bilateral hamstrings today.  Updated HEP.  Patient will benefit from continued skilled therapy servicesto address deficits and promote return to optimal function.     Session focus with lumbar mobility as well as core and proximal strengthening.  Added POE to address lumbar extension and prone/sidelying exercises for hip stability with good form and mechanics.  Most difficulty with scapular retraction, verbal and tactile cueing to improve musculature activation and reduce leaning.  Pt tolerated well to session with no reports of increased pain through session.   Eval:Patient is a 72 y.o. female who presents to physical therapy with complaint of LBP. Patient demonstrates muscle weakness,  reduced ROM, and fascial restrictions which are likely contributing to symptoms of pain and are negatively impacting patient ability to perform ADLs and functional mobility tasks. Patient will benefit from skilled physical therapy services to address these deficits to reduce pain and improve level of function with ADLs and functional mobility tasks.   OBJECTIVE IMPAIRMENTS: Abnormal gait, decreased activity tolerance, decreased balance, decreased mobility, difficulty walking, decreased ROM, decreased strength, increased fascial restrictions, impaired flexibility, improper body mechanics, and pain.   ACTIVITY LIMITATIONS: carrying, lifting, bending, sitting, standing, squatting, sleeping, stairs, transfers, bed mobility, and locomotion level  PARTICIPATION LIMITATIONS: meal prep, cleaning, laundry, driving, shopping, community activity, and yard work  PERSONAL FACTORS: Behavior pattern, Past/current experiences, and  Time since onset of injury/illness/exacerbation are also affecting patient's functional outcome.   REHAB POTENTIAL: Fair See above  CLINICAL DECISION MAKING: Stable/uncomplicated  EVALUATION COMPLEXITY: Low   GOALS: SHORT TERM GOALS: Target date: 06/04/2022  Patient will be independent with initial HEP and self-management strategies to improve functional outcomes Baseline:  Goal status: IN PROGRESS   LONG TERM GOALS: Target date: 06/25/2022  Patient will be independent with advanced HEP and self-management strategies to improve functional outcomes Baseline:  Goal status: IN PROGRESS  2.  Patient will improve FOTO score to predicted value to indicate improvement in functional outcomes Baseline: 61% function  Goal status: IN PROGRESS  3.  Patient will report at least 50% improvement in subjective complaint to indicate improvement in functional outcomes Baseline:  Goal status: IN PROGRESS  4. Patient will have equal to or > 4+/5 MMT throughout BLE to improve ability to  perform functional mobility, stair ambulation and ADLs.  Baseline: See MMT Goal status: IN PROGRESS  PLAN:  PT FREQUENCY: 2x/week  PT DURATION: 6 weeks  PLANNED INTERVENTIONS: Therapeutic exercises, Therapeutic activity, Neuromuscular re-education, Balance training, Gait training, Patient/Family education, Joint manipulation, Joint mobilization, Stair training, Aquatic Therapy, Dry Needling, Electrical stimulation, Spinal manipulation, Spinal mobilization, Cryotherapy, Moist heat, scar mobilization, Taping, Traction, Ultrasound, Biofeedback, Ionotophoresis 4mg /ml Dexamethasone, and Manual therapy. Marland Kitchen  PLAN FOR NEXT SESSION: Progress hip, core and postural strengthening as tolerated. OT referral if needed for shoulder pain   Becky Sax, LPTA/CLT; CBIS [phone]  11:27 AM, 05/23/22

## 2022-05-25 ENCOUNTER — Other Ambulatory Visit: Payer: Self-pay | Admitting: Orthopedic Surgery

## 2022-05-25 DIAGNOSIS — M5136 Other intervertebral disc degeneration, lumbar region: Secondary | ICD-10-CM

## 2022-05-27 ENCOUNTER — Ambulatory Visit (HOSPITAL_COMMUNITY): Payer: 59

## 2022-05-27 ENCOUNTER — Encounter (HOSPITAL_COMMUNITY): Payer: Self-pay

## 2022-05-27 DIAGNOSIS — M5459 Other low back pain: Secondary | ICD-10-CM | POA: Diagnosis not present

## 2022-05-27 NOTE — Therapy (Signed)
OUTPATIENT PHYSICAL THERAPY THORACOLUMBAR TREATMENT   Patient Name: Christina Erickson MRN: [id number] DOB:Dec 09, 1950, 72 y.o., female Today's Date: 05/27/2022  END OF SESSION:  PT End of Session - 05/27/22 1033     Visit Number 4    Number of Visits 12    Date for PT Re-Evaluation 06/25/22    Authorization Type UHC dual complete/ Medicaid 2ndary    Progress Note Due on Visit 10    PT Start Time 1000    PT Stop Time 1040    PT Time Calculation (min) 40 min    Activity Tolerance Patient tolerated treatment well    Behavior During Therapy WFL for tasks assessed/performed               Past Medical History:  Diagnosis Date   Arthritis    Back pain 06/25/2017   High blood pressure    High cholesterol    Pre-diabetes    Past Surgical History:  Procedure Laterality Date   BREAST LUMPECTOMY WITH RADIOACTIVE SEED LOCALIZATION Left 08/20/2021   Procedure: LEFT BREAST RADIOACTIVE SEED LOCALIZED LUMPECTOMY;  Surgeon: Manus Rudd, MD;  Location: [facility];  Service: General;  Laterality: Left;  LMA   CESAREAN SECTION     COLONOSCOPY WITH PROPOFOL N/A 03/22/2021   sigmoid diverticulosis and normal rectum and anal verge.   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   Buttock pain 01/08/2022   Allergic rhinitis due to pollen 07/26/2021   Benign essential hypertension 07/26/2021   High cholesterol 07/26/2021   Peripheral neuropathy 07/26/2021   Type 2 diabetes mellitus 07/26/2021   Change in bowel movement 12/06/2020   Abdominal pain 12/06/2020   Patellofemoral arthritis of left knee 03/09/2012    PCP: Coral Ceo  FNP  REFERRING PROVIDER: Vickki Hearing, MD  REFERRING DIAG: M51.36 (ICD-10-CM) - DDD (degenerative disc disease), lumbar  Rationale for Evaluation and Treatment: Rehabilitation  THERAPY DIAG:  Other low back pain  ONSET DATE: Chronic   SUBJECTIVE:                                                                                                                                                                                           SUBJECTIVE STATEMENT: 05/27/22:  Minimal pain while standing, majority while sitting or on  back due to the hemorrhoids, banding scheduled for May 2nd.  Current pain scale gets up 4/10 in lower back.  Main difficulty sleeping at night.  EVAL:Patient presents with complaint of back and shoulder pain. This is chronic. She has has injections and takes prescribed mediation which she say has not helped. Patient also notes she has hemorrhoids and thinks this has something to do with her back pain.   PERTINENT HISTORY:  Chronic LBP, DDD, HTN, DM   PAIN:  Are you having pain? Yes: NPRS scale: 0-4/10 Pain location: low back  Pain description: dull, discomfort Aggravating factors: bending Relieving factors: rest, meds   PRECAUTIONS: None  WEIGHT BEARING RESTRICTIONS: No  FALLS:  Has patient fallen in last 6 months? No  LIVING ENVIRONMENT: Lives with: lives with an adult companion Lives in: Mobile home Stairs: No Has following equipment at home: None  OCCUPATION: Retired  PLOF: Independent  PATIENT GOALS: "relief"   NEXT MD VISIT: "after therapy"   OBJECTIVE:   DIAGNOSTIC FINDINGS:  IMPRESSION: Degenerative disc and facet disease changes lumbar spine with osseous demineralization.   No acute abnormalities.   Aortic Atherosclerosis (ICD10-I70.0).   PATIENT SURVEYS:  FOTO 61% function   COGNITION: Overall cognitive status: Within functional limits for tasks assessed     PALPATION: Min TTP about bilat lumbar paraspinals   LUMBAR  ROM:   AROM eval  Flexion WNL  Extension 50% limited  Right lateral flexion 25% Limited  Left lateral flexion 25% Limited  Right rotation 25% Limited  Left rotation 25% Limited   (Blank rows = not tested)   LOWER EXTREMITY MMT:    MMT Right eval Left eval  Hip flexion 4 4+  Hip extension 3- 3-  Hip abduction 3+ 4  Hip adduction    Hip internal rotation    Hip external rotation    Knee flexion    Knee extension 4+ 4+  Ankle dorsiflexion 5 5  Ankle plantarflexion    Ankle inversion    Ankle eversion     (Blank rows = not tested)  LUMBAR SPECIAL TESTS:  Straight leg raise test: Negative bilat  FUNCTIONAL TESTS:  2 minute walk test: 460 feet no AD   GAIT: No AD, decreased stride but normal speed, decreased hip extension bilat  TODAY'S TREATMENT:                                                                                                                              DATE:  05/27/22: Standing: RTB shoulder extension 10x  Rows 10x 5" 3D hip excursion (lateral shift, rotation, squat then lumbar extension)  Sidelying: abd 10x  Clam with RTB 3" holds 10x  Prone: POE x 2 min Hip extension 10x  Supine:  Bridge 10x SLR 10x   05/23/22: Sidelying clam 2x 10  Supine: Bridge 15x Hamstring stretch supine with hands behind knee 2x30"  Prone: POE x Hip extension Press up 5x 10"  Standing: scapular retraction rows 10x with RTB, verbal and tactile cueing  05/20/22 Review of HEP and goals Seated: Scapular retractions x 10  Supine: LTR x 10 Glute sets 5" hold x 10 Bridge x 10 Abdominal bracing 5" hold x 10 Abdominal bracing with marching 2 x 10 SKTC 20" hold x 3 Active hamstring stretch 3 x 20"   05/14/22 Eval     PATIENT EDUCATION:  Education details: On Eval findings, POC and HEP  Person educated: Patient Education method: Explanation Education comprehension: verbalized understanding  HOME EXERCISE PROGRAM: Access Code: [email or web address] URL:  [email or web address] Date: 05/20/2022 Prepared by: [facility] - [facility]  Exercises Access Code: [email or web address] URL: [email or web address] Date: 05/27/2022 Prepared by: Becky Sax  - Prone on Elbows Stretch  - 1 x daily - 7 x weekly - 3 sets - 3 reps - 2' hold - Prone Press Up  - 1 x daily - 7 x weekly - 3 sets - 10 reps - 10" hold - Standing Lumbar Extension  - 1 x daily - 7 x weekly - 3 sets - 10 reps - 5" hold - Clamshell  - 1 x daily - 7 x weekly - 3 sets - 10 reps - 5" hold   - Supine Lower Trunk Rotation  - 2-3 x daily - 7 x weekly - 1 sets - 10 reps - 5 second hold - Hooklying Gluteal Sets  - 2-3 x daily - 7 x weekly - 1 sets - 10 reps - 5 second hold - Seated Scapular Retraction  - 2-3 x daily - 7 x weekly - 1 sets - 10 reps - 5 second hold - Supine Bridge  - 2-3 x daily - 7 x weekly - 1 sets - 10 reps - Supine Transversus Abdominis Bracing - Hands on Stomach  - 2-3 x daily - 7 x weekly - 1 sets - 10 reps - Supine March  - 2-3 x daily - 7 x weekly - 2 sets - 10 reps - Hooklying Single Knee to Chest Stretch  - 2-3 x daily - 7 x weekly - 1 sets - 3 reps - 20" hold - Supine Hamstring Stretch  - 2-3 x daily - 7 x weekly - 1 sets - 3 reps - 20 sec hold Access Code: [email or web address] URL: [email or web address] Date: 05/14/2022 Prepared by: Georges Lynch  Exercises - Supine Lower Trunk Rotation  - 2-3 x daily - 7 x weekly - 1 sets - 10 reps - 5 second hold - Hooklying Gluteal Sets  - 2-3 x daily - 7 x weekly - 1 sets - 10 reps - 5 second hold - Seated Scapular Retraction  - 2-3 x daily - 7 x weekly - 1 sets - 10 reps - 5 second hold  ASSESSMENT:  CLINICAL IMPRESSION: Continued session focus with lumbar mobility and core/proximal strengthening.  Progressed standing exercises for mobility and functional strengthening with cueing for stability and foam.  Improved scapular retraction though continues to require verbal and tactile cueing.  No reports of  increased pain through session.  Main issue with fatigue with increased demand.  Pt stated main pain at night, feels the hemorrhoids play a big part.  Discussed recommended sleeping positions for spinal alignment for pain control.  Eval:Patient is a 72 y.o. female who presents to physical therapy with complaint of LBP. Patient demonstrates muscle weakness, reduced ROM, and fascial restrictions which are likely contributing to symptoms of pain and are negatively impacting patient ability to perform ADLs and functional mobility  tasks. Patient will benefit from skilled physical therapy services to address these deficits to reduce pain and improve level of function with ADLs and functional mobility tasks.   OBJECTIVE IMPAIRMENTS: Abnormal gait, decreased activity tolerance, decreased balance, decreased mobility, difficulty walking, decreased ROM, decreased strength, increased fascial restrictions, impaired flexibility, improper body mechanics, and pain.   ACTIVITY LIMITATIONS: carrying, lifting, bending, sitting, standing, squatting, sleeping, stairs, transfers, bed mobility, and locomotion level  PARTICIPATION LIMITATIONS: meal prep, cleaning, laundry, driving, shopping, community activity, and yard work  PERSONAL FACTORS: Behavior pattern, Past/current experiences, and Time since onset of injury/illness/exacerbation are also affecting patient's functional outcome.   REHAB POTENTIAL: Fair See above  CLINICAL DECISION MAKING: Stable/uncomplicated  EVALUATION COMPLEXITY: Low   GOALS: SHORT TERM GOALS: Target date: 06/04/2022  Patient will be independent with initial HEP and self-management strategies to improve functional outcomes Baseline:  Goal status: IN PROGRESS   LONG TERM GOALS: Target date: 06/25/2022  Patient will be independent with advanced HEP and self-management strategies to improve functional outcomes Baseline:  Goal status: IN PROGRESS  2.  Patient will improve FOTO score to  predicted value to indicate improvement in functional outcomes Baseline: 61% function  Goal status: IN PROGRESS  3.  Patient will report at least 50% improvement in subjective complaint to indicate improvement in functional outcomes Baseline:  Goal status: IN PROGRESS  4. Patient will have equal to or > 4+/5 MMT throughout BLE to improve ability to perform functional mobility, stair ambulation and ADLs.  Baseline: See MMT Goal status: IN PROGRESS  PLAN:  PT FREQUENCY: 2x/week  PT DURATION: 6 weeks  PLANNED INTERVENTIONS: Therapeutic exercises, Therapeutic activity, Neuromuscular re-education, Balance training, Gait training, Patient/Family education, Joint manipulation, Joint mobilization, Stair training, Aquatic Therapy, Dry Needling, Electrical stimulation, Spinal manipulation, Spinal mobilization, Cryotherapy, Moist heat, scar mobilization, Taping, Traction, Ultrasound, Biofeedback, Ionotophoresis /ml Dexamethasone, and Manual therapy. Marland Kitchen  PLAN FOR NEXT SESSION: Progress hip, core and postural strengthening as tolerated. OT referral if needed for shoulder pain   Becky Sax, LPTA/CLT; CBIS [phone]  10:47 AM, 05/27/22

## 2022-06-03 ENCOUNTER — Ambulatory Visit (HOSPITAL_COMMUNITY): Payer: 59

## 2022-06-03 ENCOUNTER — Encounter (HOSPITAL_COMMUNITY): Payer: Self-pay

## 2022-06-03 DIAGNOSIS — M5459 Other low back pain: Secondary | ICD-10-CM

## 2022-06-03 NOTE — Therapy (Signed)
OUTPATIENT PHYSICAL THERAPY THORACOLUMBAR TREATMENT   Patient Name: Christina Erickson MRN: [id number] DOB:1950-06-01, 72 y.o., female Today's Date: 06/03/2022  END OF SESSION:  PT End of Session - 06/03/22 1124     Visit Number 5    Number of Visits 12    Date for PT Re-Evaluation 06/25/22    Authorization Type UHC dual complete/ Medicaid 2ndary    Progress Note Due on Visit 10    PT Start Time 1047    PT Stop Time 1125    PT Time Calculation (min) 38 min    Activity Tolerance Patient tolerated treatment well    Behavior During Therapy WFL for tasks assessed/performed                Past Medical History:  Diagnosis Date   Arthritis    Back pain 06/25/2017   High blood pressure    High cholesterol    Pre-diabetes    Past Surgical History:  Procedure Laterality Date   BREAST LUMPECTOMY WITH RADIOACTIVE SEED LOCALIZATION Left 08/20/2021   Procedure: LEFT BREAST RADIOACTIVE SEED LOCALIZED LUMPECTOMY;  Surgeon: Manus Rudd, MD;  Location: [facility] OR;  Service: General;  Laterality: Left;  LMA   CESAREAN SECTION     COLONOSCOPY WITH PROPOFOL N/A 03/22/2021   sigmoid diverticulosis and normal rectum and anal verge.   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   Buttock pain 01/08/2022   Allergic rhinitis due to pollen 07/26/2021   Benign essential hypertension 07/26/2021   High cholesterol 07/26/2021   Peripheral neuropathy 07/26/2021   Type 2 diabetes mellitus (HCC) 07/26/2021   Change in bowel movement 12/06/2020   Abdominal pain 12/06/2020   Patellofemoral arthritis of left knee 03/09/2012    PCP: Coral Ceo  FNP  REFERRING PROVIDER: Vickki Hearing, MD  REFERRING DIAG: M51.36 (ICD-10-CM) - DDD (degenerative disc disease), lumbar  Rationale for Evaluation and Treatment: Rehabilitation  THERAPY DIAG:  Other low back pain  ONSET DATE: Chronic   SUBJECTIVE:                                                                                                                                                                                           SUBJECTIVE STATEMENT: 06/03/22:  Feeling good today, exercises are going well  at home.  No reports of pain currently.  Has plans for banding hemorrhoids later this week.  Stated main difficulty is getting fatigued at night, limited ability to stand for longer periods at night.    EVAL:Patient presents with complaint of back and shoulder pain. This is chronic. She has has injections and takes prescribed mediation which she say has not helped. Patient also notes she has hemorrhoids and thinks this has something to do with her back pain.   PERTINENT HISTORY:  Chronic LBP, DDD, HTN, DM   PAIN:  Are you having pain? Yes: NPRS scale: 0-4/10 Pain location: low back  Pain description: dull, discomfort Aggravating factors: bending Relieving factors: rest, meds   PRECAUTIONS: None  WEIGHT BEARING RESTRICTIONS: No  FALLS:  Has patient fallen in last 6 months? No  LIVING ENVIRONMENT: Lives with: lives with an adult companion Lives in: Mobile home Stairs: No Has following equipment at home: None  OCCUPATION: Retired  PLOF: Independent  PATIENT GOALS: "relief"   NEXT MD VISIT: "after therapy"   OBJECTIVE:   DIAGNOSTIC FINDINGS:  IMPRESSION: Degenerative disc and facet disease changes lumbar spine with osseous demineralization.   No acute abnormalities.   Aortic Atherosclerosis (ICD10-I70.0).   PATIENT SURVEYS:  FOTO 61% function   COGNITION: Overall cognitive status: Within functional limits for tasks assessed     PALPATION: Min TTP  about bilat lumbar paraspinals   LUMBAR ROM:   AROM eval  Flexion WNL  Extension 50% limited  Right lateral flexion 25% Limited  Left lateral flexion 25% Limited  Right rotation 25% Limited  Left rotation 25% Limited   (Blank rows = not tested)   LOWER EXTREMITY MMT:    MMT Right eval Left eval  Hip flexion 4 4+  Hip extension 3- 3-  Hip abduction 3+ 4  Hip adduction    Hip internal rotation    Hip external rotation    Knee flexion    Knee extension 4+ 4+  Ankle dorsiflexion 5 5  Ankle plantarflexion    Ankle inversion    Ankle eversion     (Blank rows = not tested)  LUMBAR SPECIAL TESTS:  Straight leg raise test: Negative bilat  FUNCTIONAL TESTS:  2 minute walk test: 460 feet no AD   GAIT: No AD, decreased stride but normal speed, decreased hip extension bilat  TODAY'S TREATMENT:                                                                                                                              DATE:  06/03/27: Standing: 3D hip excursion (lateral shift, rotation, squat then lumbar extension) RTB shoulder extension 10x  Rows 10x 5" STS eccentric control 10 no HHA Heel raise with ab set 10x Marching 10x Abduction 10x 3"  Prone: press ups 5x 10" Hip extension 10x  05/27/22: Standing: RTB shoulder extension 10x  Rows 10x 5" 3D hip excursion (lateral shift, rotation, squat then lumbar extension)  Sidelying: abd 10x  Clam with RTB 3" holds 10x  Prone: POE x 2 min Hip extension 10x  Supine:  Bridge 10x SLR 10x   05/23/22: Sidelying clam 2x 10  Supine: Bridge 15x Hamstring stretch supine with hands behind knee 2x30"  Prone: POE x Hip extension Press up 5x 10"  Standing: scapular retraction rows 10x with RTB, verbal and tactile cueing  05/20/22 Review of HEP and goals Seated: Scapular retractions x 10  Supine: LTR x 10 Glute sets 5" hold x 10 Bridge x 10 Abdominal bracing 5" hold x 10 Abdominal bracing with marching 2  x 10 SKTC 20" hold x 3 Active hamstring stretch 3 x 20"   05/14/22 Eval     PATIENT EDUCATION:  Education details: On Eval findings, POC and HEP  Person educated: Patient Education method: Explanation Education comprehension: verbalized understanding  HOME EXERCISE PROGRAM: Access Code: [email or web address] URL: [email or web address] Date: 05/20/2022 Prepared by: [facility] 4/30/204: - Standing March with Unilateral Counter Support  - 1 x daily - 7 x weekly - 3 sets - 10 reps - 5" hold - Squat with Chair Touch  - 1 x daily - 7 x weekly - 3 sets - 10 reps  Exercises Access Code: [email or web address] URL: [email or web address] Date: 05/27/2022 Prepared by: Becky Sax  - Prone on Elbows Stretch  - 1 x daily - 7 x weekly - 3 sets - 3 reps - 2' hold - Prone Press Up  - 1 x daily - 7 x weekly - 3 sets - 10 reps - 10" hold - Standing Lumbar Extension  - 1 x daily - 7 x weekly - 3 sets - 10 reps - 5" hold - Clamshell  - 1 x daily - 7 x weekly - 3 sets - 10 reps - 5" hold   - Supine Lower Trunk Rotation  - 2-3 x daily - 7 x weekly - 1 sets - 10 reps - 5 second hold - Hooklying Gluteal Sets  - 2-3 x daily - 7 x weekly - 1 sets - 10 reps - 5 second hold - Seated Scapular Retraction  - 2-3 x daily - 7 x weekly - 1 sets - 10 reps - 5 second hold - Supine Bridge  - 2-3 x daily - 7 x weekly - 1 sets - 10 reps - Supine Transversus Abdominis Bracing - Hands on Stomach  - 2-3 x daily - 7 x weekly - 1 sets - 10 reps - Supine March  - 2-3 x daily - 7 x weekly - 2 sets - 10 reps - Hooklying Single Knee to Chest Stretch  - 2-3 x daily - 7 x weekly - 1 sets - 3 reps - 20" hold - Supine Hamstring Stretch  - 2-3 x daily - 7 x weekly - 1 sets - 3 reps - 20 sec hold Access Code: [email or web address] URL: [email or web address] Date: 05/14/2022 Prepared by: Georges Lynch  Exercises - Supine Lower Trunk Rotation  - 2-3 x daily - 7 x weekly - 1 sets - 10 reps - 5 second hold -  Hooklying Gluteal Sets  - 2-3 x daily - 7 x weekly - 1 sets - 10 reps - 5 second hold - Seated Scapular Retraction  - 2-3 x daily - 7 x weekly - 1 sets - 10 reps - 5 second hold  ASSESSMENT:  CLINICAL IMPRESSION: Continued session focus with lumbar mobility and core/proximal strengthening.  Increased standing activities to address reports of decreased standing  tolerance.  Cueing for abdominal sets through session to improve posture.  Pt tolerated well to session with no reports of pain, was limited by fatigue with increased activities.    Eval:Patient is a 72 y.o. female who presents to physical therapy with complaint of LBP. Patient demonstrates muscle weakness, reduced ROM, and fascial restrictions which are likely contributing to symptoms of pain and are negatively impacting patient ability to perform ADLs and functional mobility tasks. Patient will benefit from skilled physical therapy services to address these deficits to reduce pain and improve level of function with ADLs and functional mobility tasks.   OBJECTIVE IMPAIRMENTS: Abnormal gait, decreased activity tolerance, decreased balance, decreased mobility, difficulty walking, decreased ROM, decreased strength, increased fascial restrictions, impaired flexibility, improper body mechanics, and pain.   ACTIVITY LIMITATIONS: carrying, lifting, bending, sitting, standing, squatting, sleeping, stairs, transfers, bed mobility, and locomotion level  PARTICIPATION LIMITATIONS: meal prep, cleaning, laundry, driving, shopping, community activity, and yard work  PERSONAL FACTORS: Behavior pattern, Past/current experiences, and Time since onset of injury/illness/exacerbation are also affecting patient's functional outcome.   REHAB POTENTIAL: Fair See above  CLINICAL DECISION MAKING: Stable/uncomplicated  EVALUATION COMPLEXITY: Low   GOALS: SHORT TERM GOALS: Target date: 06/04/2022  Patient will be independent with initial HEP and  self-management strategies to improve functional outcomes Baseline:  Goal status: IN PROGRESS   LONG TERM GOALS: Target date: 06/25/2022  Patient will be independent with advanced HEP and self-management strategies to improve functional outcomes Baseline:  Goal status: IN PROGRESS  2.  Patient will improve FOTO score to predicted value to indicate improvement in functional outcomes Baseline: 61% function  Goal status: IN PROGRESS  3.  Patient will report at least 50% improvement in subjective complaint to indicate improvement in functional outcomes Baseline:  Goal status: IN PROGRESS  4. Patient will have equal to or > 4+/5 MMT throughout BLE to improve ability to perform functional mobility, stair ambulation and ADLs.  Baseline: See MMT Goal status: IN PROGRESS  PLAN:  PT FREQUENCY: 2x/week  PT DURATION: 6 weeks  PLANNED INTERVENTIONS: Therapeutic exercises, Therapeutic activity, Neuromuscular re-education, Balance training, Gait training, Patient/Family education, Joint manipulation, Joint mobilization, Stair training, Aquatic Therapy, Dry Needling, Electrical stimulation, Spinal manipulation, Spinal mobilization, Cryotherapy, Moist heat, scar mobilization, Taping, Traction, Ultrasound, Biofeedback, Ionotophoresis 4mg /ml Dexamethasone, and Manual therapy. Marland Kitchen  PLAN FOR NEXT SESSION: Progress hip, core and postural strengthening as tolerated. Progress stability with additional SLS activities.  OT referral if needed for shoulder pain   Becky Sax, LPTA/CLT; CBIS [phone]  3:43 PM, 06/03/22

## 2022-06-05 ENCOUNTER — Encounter: Payer: Self-pay | Admitting: Gastroenterology

## 2022-06-05 ENCOUNTER — Ambulatory Visit (INDEPENDENT_AMBULATORY_CARE_PROVIDER_SITE_OTHER): Payer: 59 | Admitting: Gastroenterology

## 2022-06-05 VITALS — BP 122/72 | HR 86 | Temp 97.6°F | Ht 62.0 in | Wt 137.8 lb

## 2022-06-05 DIAGNOSIS — K640 First degree hemorrhoids: Secondary | ICD-10-CM

## 2022-06-05 NOTE — Patient Instructions (Signed)
  Please avoid straining.  You should limit your toilet time to 2-3 minutes at the most.   I recommend Benefiber 2 teaspoons each morning in the beverage of your choice!  Please call me with any concerns or issues!  We will have you follow-up with Dr. Levon Hedger!  I enjoyed seeing you again today! At our first visit, I mentioned how I value our relationship and want to provide genuine, compassionate, and quality care. You may receive a survey regarding your visit with me, and I welcome your feedback! Thanks so much for taking the time to complete this. I look forward to seeing you again.   Gelene Mink, PhD, ANP-BC [facility]

## 2022-06-05 NOTE — Progress Notes (Signed)
    [facility] BANDING PROCEDURE NOTE  Christina Erickson is a 72 y.o. female presenting today for consideration of hemorrhoid banding. Last colonoscopy Feb 2023 and Flex sig in March 2024. Flex sigmoidoscopy with poor prep of colon, stool in sigmoid colon, diverticulosis in sigmoid colon, non bleeding internal hemorrhoids. Started on Elavil for proctalgia. She has had left lateral banding and right anterior banding. She still is noting pressure. No significant improvement.   The patient presents with symptomatic grade 1 hemorrhoids, unresponsive to maximal medical therapy, requesting rubber band ligation of her hemorrhoidal disease. All risks, benefits, and alternative forms of therapy were described and informed consent was obtained.  The decision was made to band neutrally, and the [facility] System was used to perform band ligation without complication. Appears to be in right anterior position. Good amount of tissue.  Digital anorectal examination was then performed to assure proper positioning of the band, and to adjust the banded tissue as required. The patient was discharged home without pain or other issues. Dietary and behavioral recommendations were given, along with follow-up instructions. The patient will return in follow-up to see Dr. Levon Hedger. I feel she has maximized hemorrhoid banding benefit at this point.   No complications were encountered and the patient tolerated the procedure well.   Gelene Mink, PhD, ANP-BC [facility]

## 2022-06-09 ENCOUNTER — Ambulatory Visit (HOSPITAL_COMMUNITY): Payer: 59 | Attending: Orthopedic Surgery

## 2022-06-09 DIAGNOSIS — M5459 Other low back pain: Secondary | ICD-10-CM | POA: Diagnosis present

## 2022-06-09 NOTE — Therapy (Signed)
OUTPATIENT PHYSICAL THERAPY THORACOLUMBAR TREATMENT   Patient Name: Christina Erickson MRN: [id number] DOB:[birth year], 72 y.o., female Today's Date: 06/09/2022  END OF SESSION:  PT End of Session - 06/09/22 0959     Visit Number 6    Number of Visits 12    Date for PT Re-Evaluation 06/25/22    Authorization Type UHC dual complete/ Medicaid 2ndary    Progress Note Due on Visit 10    PT Start Time 0950    PT Stop Time 1028    PT Time Calculation (min) 38 min                 Past Medical History:  Diagnosis Date   Arthritis    Back pain 06/25/2017   High blood pressure    High cholesterol    Pre-diabetes    Past Surgical History:  Procedure Laterality Date   BREAST LUMPECTOMY WITH RADIOACTIVE SEED LOCALIZATION Left 08/20/2021   Procedure: LEFT BREAST RADIOACTIVE SEED LOCALIZED LUMPECTOMY;  Surgeon: Manus Rudd, MD;  Location: [facility];  Service: General;  Laterality: Left;  LMA   CESAREAN SECTION     COLONOSCOPY WITH PROPOFOL N/A 03/22/2021   sigmoid diverticulosis and normal rectum and anal verge.   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   Buttock pain 01/08/2022   Allergic rhinitis due to pollen 07/26/2021   Benign essential hypertension 07/26/2021   High cholesterol 07/26/2021   Peripheral neuropathy 07/26/2021   Type 2 diabetes mellitus (HCC) 07/26/2021   Change in bowel movement 12/06/2020   Abdominal pain 12/06/2020   Patellofemoral arthritis of left knee 03/09/2012    PCP: Coral Ceo FNP  REFERRING PROVIDER: Vickki Hearing, MD  REFERRING DIAG: M51.36 (ICD-10-CM) - DDD (degenerative disc  disease), lumbar  Rationale for Evaluation and Treatment: Rehabilitation  THERAPY DIAG:  Other low back pain  ONSET DATE: Chronic   SUBJECTIVE:                                                                                                                                                                                           SUBJECTIVE STATEMENT: 06/09/22 had hemorrhoids banded (last one) done end of last week so a little sore from that;  back is feeling ok but sometimes at night it  bothers her  EVAL:Patient presents with complaint of back and shoulder pain. This is chronic. She has has injections and takes prescribed mediation which she say has not helped. Patient also notes she has hemorrhoids and thinks this has something to do with her back pain.   PERTINENT HISTORY:  Chronic LBP, DDD, HTN, DM   PAIN:  Are you having pain? Yes: NPRS scale: 0-4/10 Pain location: low back  Pain description: dull, discomfort Aggravating factors: bending Relieving factors: rest, meds   PRECAUTIONS: None  WEIGHT BEARING RESTRICTIONS: No  FALLS:  Has patient fallen in last 6 months? No  LIVING ENVIRONMENT: Lives with: lives with an adult companion Lives in: Mobile home Stairs: No Has following equipment at home: None  OCCUPATION: Retired  PLOF: Independent  PATIENT GOALS: "relief"   NEXT MD VISIT: "after therapy"   OBJECTIVE:   DIAGNOSTIC FINDINGS:  IMPRESSION: Degenerative disc and facet disease changes lumbar spine with osseous demineralization.   No acute abnormalities.   Aortic Atherosclerosis (ICD10-I70.0).   PATIENT SURVEYS:  FOTO 61% function   COGNITION: Overall cognitive status: Within functional limits for tasks assessed     PALPATION: Min TTP about bilat lumbar paraspinals   LUMBAR ROM:   AROM eval  Flexion WNL  Extension 50% limited  Right lateral flexion 25% Limited  Left lateral flexion 25% Limited  Right rotation 25% Limited  Left rotation 25%  Limited   (Blank rows = not tested)   LOWER EXTREMITY MMT:    MMT Right eval Left eval  Hip flexion 4 4+  Hip extension 3- 3-  Hip abduction 3+ 4  Hip adduction    Hip internal rotation    Hip external rotation    Knee flexion    Knee extension 4+ 4+  Ankle dorsiflexion 5 5  Ankle plantarflexion    Ankle inversion    Ankle eversion     (Blank rows = not tested)  LUMBAR SPECIAL TESTS:  Straight leg raise test: Negative bilat  FUNCTIONAL TESTS:  2 minute walk test: 460 feet no AD   GAIT: No AD, decreased stride but normal speed, decreased hip extension bilat  TODAY'S TREATMENT:                                                                                                                              DATE:  06/09/22 Prone: Press ups 5" hold x 10 Hip extension 2 x 10 each 2# hamstring curls 2 x 10 each  Standing: Heel raises 2 x 10 Hip abduction 2# 2 x 10 Marching 2 # 2 x 10 RTB shoulder rows 2 x 10 RTB shoulder extensions 2 x 10 Slant board 5 x 20" 1# W back x 10       06/03/27: Standing: 3D hip excursion (lateral shift, rotation, squat then lumbar extension) RTB shoulder extension 10x  Rows 10x 5" STS eccentric control 10 no HHA Heel raise with ab set 10x Marching 10x  Abduction 10x 3"  Prone: press ups 5x 10" Hip extension 10x  05/27/22: Standing: RTB shoulder extension 10x  Rows 10x 5" 3D hip excursion (lateral shift, rotation, squat then lumbar extension)  Sidelying: abd 10x  Clam with RTB 3" holds 10x  Prone: POE x 2 min Hip extension 10x  Supine:  Bridge 10x SLR 10x   05/23/22: Sidelying clam 2x 10  Supine: Bridge 15x Hamstring stretch supine with hands behind knee 2x30"  Prone: POE x Hip extension Press up 5x 10"  Standing: scapular retraction rows 10x with RTB, verbal and tactile cueing  05/20/22 Review of HEP and goals Seated: Scapular retractions x 10  Supine: LTR x 10 Glute sets 5" hold x 10 Bridge x  10 Abdominal bracing 5" hold x 10 Abdominal bracing with marching 2 x 10 SKTC 20" hold x 3 Active hamstring stretch 3 x 20"   05/14/22 Eval     PATIENT EDUCATION:  Education details: On Eval findings, POC and HEP  Person educated: Patient Education method: Explanation Education comprehension: verbalized understanding  HOME EXERCISE PROGRAM: Access Code: [email or web address] URL: [email or web address] Date: 05/20/2022 Prepared by: [facility] - Rehab 4/30/204: - Standing March with Unilateral Counter Support  - 1 x daily - 7 x weekly - 3 sets - 10 reps - 5" hold - Squat with Chair Touch  - 1 x daily - 7 x weekly - 3 sets - 10 reps  Exercises Access Code: [email or web address] URL: [email or web address] Date: 05/27/2022 Prepared by: Becky Sax  - Prone on Elbows Stretch  - 1 x daily - 7 x weekly - 3 sets - 3 reps - 2' hold - Prone Press Up  - 1 x daily - 7 x weekly - 3 sets - 10 reps - 10" hold - Standing Lumbar Extension  - 1 x daily - 7 x weekly - 3 sets - 10 reps - 5" hold - Clamshell  - 1 x daily - 7 x weekly - 3 sets - 10 reps - 5" hold   - Supine Lower Trunk Rotation  - 2-3 x daily - 7 x weekly - 1 sets - 10 reps - 5 second hold - Hooklying Gluteal Sets  - 2-3 x daily - 7 x weekly - 1 sets - 10 reps - 5 second hold - Seated Scapular Retraction  - 2-3 x daily - 7 x weekly - 1 sets - 10 reps - 5 second hold - Supine Bridge  - 2-3 x daily - 7 x weekly - 1 sets - 10 reps - Supine Transversus Abdominis Bracing - Hands on Stomach  - 2-3 x daily - 7 x weekly - 1 sets - 10 reps - Supine March  - 2-3 x daily - 7 x weekly - 2 sets - 10 reps - Hooklying Single Knee to Chest Stretch  - 2-3 x daily - 7 x weekly - 1 sets - 3 reps - 20" hold - Supine Hamstring Stretch  - 2-3 x daily - 7 x weekly - 1 sets - 3 reps - 20 sec hold Access Code: [email or web address] URL: [email or web address] Date: 05/14/2022 Prepared by: Georges Jabre Heo  Exercises - Supine Lower Trunk Rotation  -  2-3 x daily - 7 x weekly - 1 sets - 10 reps - 5 second hold - Hooklying Gluteal Sets  - 2-3 x daily - 7 x weekly - 1 sets - 10 reps - 5 second hold - Seated Scapular Retraction  - 2-3 x daily - 7 x weekly -  1 sets - 10 reps - 5 second hold  ASSESSMENT:  CLINICAL IMPRESSION: Continued session focus with lumbar mobility and core/proximal strengthening.  Patient with discomfort with sitting due to recent hemorrhoid banding.  Added weights and increased reps to lower extremity and hip exercise for increased challenge; patient needs only occasional cues for posturing with standing exercise.  Patient will benefit from continued skilled therapy services to address deficits and promote return to optimal function.      Eval:Patient is a 72 y.o. female who presents to physical therapy with complaint of LBP. Patient demonstrates muscle weakness, reduced ROM, and fascial restrictions which are likely contributing to symptoms of pain and are negatively impacting patient ability to perform ADLs and functional mobility tasks. Patient will benefit from skilled physical therapy services to address these deficits to reduce pain and improve level of function with ADLs and functional mobility tasks.   OBJECTIVE IMPAIRMENTS: Abnormal gait, decreased activity tolerance, decreased balance, decreased mobility, difficulty walking, decreased ROM, decreased strength, increased fascial restrictions, impaired flexibility, improper body mechanics, and pain.   ACTIVITY LIMITATIONS: carrying, lifting, bending, sitting, standing, squatting, sleeping, stairs, transfers, bed mobility, and locomotion level  PARTICIPATION LIMITATIONS: meal prep, cleaning, laundry, driving, shopping, community activity, and yard work  PERSONAL FACTORS: Behavior pattern, Past/current experiences, and Time since onset of injury/illness/exacerbation are also affecting patient's functional outcome.   REHAB POTENTIAL: Fair See above  CLINICAL DECISION  MAKING: Stable/uncomplicated  EVALUATION COMPLEXITY: Low   GOALS: SHORT TERM GOALS: Target date: 06/04/2022  Patient will be independent with initial HEP and self-management strategies to improve functional outcomes Baseline:  Goal status: IN PROGRESS   LONG TERM GOALS: Target date: 06/25/2022  Patient will be independent with advanced HEP and self-management strategies to improve functional outcomes Baseline:  Goal status: IN PROGRESS  2.  Patient will improve FOTO score to predicted value to indicate improvement in functional outcomes Baseline: 61% function  Goal status: IN PROGRESS  3.  Patient will report at least 50% improvement in subjective complaint to indicate improvement in functional outcomes Baseline:  Goal status: IN PROGRESS  4. Patient will have equal to or > 4+/5 MMT throughout BLE to improve ability to perform functional mobility, stair ambulation and ADLs.  Baseline: See MMT Goal status: IN PROGRESS  PLAN:  PT FREQUENCY: 2x/week  PT DURATION: 6 weeks  PLANNED INTERVENTIONS: Therapeutic exercises, Therapeutic activity, Neuromuscular re-education, Balance training, Gait training, Patient/Family education, Joint manipulation, Joint mobilization, Stair training, Aquatic Therapy, Dry Needling, Electrical stimulation, Spinal manipulation, Spinal mobilization, Cryotherapy, Moist heat, scar mobilization, Taping, Traction, Ultrasound, Biofeedback, Ionotophoresis 4mg /ml Dexamethasone, and Manual therapy. Marland Kitchen  PLAN FOR NEXT SESSION: Progress hip, core and postural strengthening as tolerated. Progress stability with additional SLS activities.  OT referral if needed for shoulder pain  10:29 AM, 06/09/22 Miamor Ayler Small Artavis Cowie MPT [facility]  [phone]

## 2022-06-11 ENCOUNTER — Encounter (HOSPITAL_COMMUNITY): Payer: Self-pay

## 2022-06-11 ENCOUNTER — Ambulatory Visit (HOSPITAL_COMMUNITY): Payer: 59

## 2022-06-11 DIAGNOSIS — M5459 Other low back pain: Secondary | ICD-10-CM | POA: Diagnosis not present

## 2022-06-11 NOTE — Therapy (Signed)
OUTPATIENT PHYSICAL THERAPY THORACOLUMBAR TREATMENT   Patient Name: Christina Erickson MRN: [id number] DOB:Aug 26, 1950, 72 y.o., female Today's Date: 06/11/2022  END OF SESSION:  PT End of Session - 06/11/22 0959     Visit Number 7    Number of Visits 12    Date for PT Re-Evaluation 06/25/22    Authorization Type UHC dual complete/ Medicaid 2ndary    Progress Note Due on Visit 10    PT Start Time 1003    PT Stop Time 1042    PT Time Calculation (min) 39 min    Activity Tolerance Patient tolerated treatment well    Behavior During Therapy WFL for tasks assessed/performed                 Past Medical History:  Diagnosis Date   Arthritis    Back pain 06/25/2017   High blood pressure    High cholesterol    Pre-diabetes    Past Surgical History:  Procedure Laterality Date   BREAST LUMPECTOMY WITH RADIOACTIVE SEED LOCALIZATION Left 08/20/2021   Procedure: LEFT BREAST RADIOACTIVE SEED LOCALIZED LUMPECTOMY;  Surgeon: Manus Rudd, MD;  Location: [facility];  Service: General;  Laterality: Left;  LMA   CESAREAN SECTION     COLONOSCOPY WITH PROPOFOL N/A 03/22/2021   sigmoid diverticulosis and normal rectum and anal verge.   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   Buttock pain 01/08/2022   Allergic rhinitis due to pollen 07/26/2021   Benign essential hypertension 07/26/2021   High cholesterol 07/26/2021   Peripheral neuropathy 07/26/2021   Type 2 diabetes mellitus (HCC) 07/26/2021   Change in bowel movement 12/06/2020   Abdominal pain 12/06/2020   Patellofemoral arthritis of left knee 03/09/2012    PCP: Coral Ceo  FNP  REFERRING PROVIDER: Vickki Hearing, MD  REFERRING DIAG: M51.36 (ICD-10-CM) - DDD (degenerative disc disease), lumbar  Rationale for Evaluation and Treatment: Rehabilitation  THERAPY DIAG:  Other low back pain  ONSET DATE: Chronic   SUBJECTIVE:                                                                                                                                                                                           SUBJECTIVE STATEMENT: 06/11/22:  No reports of back pain today,  does continue to have some discomfort while sitting.  Stated she has improved tolerance for resting at night.  EVAL:Patient presents with complaint of back and shoulder pain. This is chronic. She has has injections and takes prescribed mediation which she say has not helped. Patient also notes she has hemorrhoids and thinks this has something to do with her back pain.   PERTINENT HISTORY:  Chronic LBP, DDD, HTN, DM   PAIN:  Are you having pain? Yes: NPRS scale: 0-4/10 Pain location: low back  Pain description: dull, discomfort Aggravating factors: bending Relieving factors: rest, meds   PRECAUTIONS: None  WEIGHT BEARING RESTRICTIONS: No  FALLS:  Has patient fallen in last 6 months? No  LIVING ENVIRONMENT: Lives with: lives with an adult companion Lives in: Mobile home Stairs: No Has following equipment at home: None  OCCUPATION: Retired  PLOF: Independent  PATIENT GOALS: "relief"   NEXT MD VISIT: "after therapy"   OBJECTIVE:   DIAGNOSTIC FINDINGS:  IMPRESSION: Degenerative disc and facet disease changes lumbar spine with osseous demineralization.   No acute abnormalities.   Aortic Atherosclerosis (ICD10-I70.0).   PATIENT SURVEYS:  FOTO 61% function   COGNITION: Overall cognitive status: Within functional limits for tasks assessed     PALPATION: Min TTP about bilat lumbar paraspinals   LUMBAR ROM:   AROM eval  Flexion WNL  Extension 50% limited  Right  lateral flexion 25% Limited  Left lateral flexion 25% Limited  Right rotation 25% Limited  Left rotation 25% Limited   (Blank rows = not tested)   LOWER EXTREMITY MMT:    MMT Right eval Left eval  Hip flexion 4 4+  Hip extension 3- 3-  Hip abduction 3+ 4  Hip adduction    Hip internal rotation    Hip external rotation    Knee flexion    Knee extension 4+ 4+  Ankle dorsiflexion 5 5  Ankle plantarflexion    Ankle inversion    Ankle eversion     (Blank rows = not tested)  LUMBAR SPECIAL TESTS:  Straight leg raise test: Negative bilat  FUNCTIONAL TESTS:  2 minute walk test: 460 feet no AD   GAIT: No AD, decreased stride but normal speed, decreased hip extension bilat  TODAY'S TREATMENT:                                                                                                                              DATE:  06/11/22: Standing:  Heel raises 15x incline slope  Toe raises 15x decline slope  SLS Rt 5" Lt 10" max of 3  Vector stance 3x 5" STS eccentric control 10x  Squat front of chair 10x GTB shoulder rows 2 x 10- HEP GTB shoulder extensions 2 x 10- HEP Tandem stance 2x 30" Bodycraft walk out 2Pl 3RT retro and sidestep 06/09/22 Prone: Press ups 5" hold x 10 Hip extension 2 x 10 each 2# hamstring curls 2 x 10 each  Standing: Heel raises 2 x 10 Hip abduction 2# 2 x 10 Marching 2 # 2 x 10 RTB shoulder rows 2 x 10 RTB shoulder extensions 2 x 10 Slant board 5 x 20" 1# W back x 10       06/03/27: Standing: 3D hip excursion (lateral shift, rotation, squat then lumbar extension) RTB shoulder extension 10x  Rows 10x 5" STS eccentric control 10 no HHA Heel raise with ab set 10x Marching 10x Abduction 10x 3"  Prone: press ups 5x 10" Hip extension 10x  05/27/22: Standing: RTB shoulder extension 10x  Rows 10x 5" 3D hip excursion (lateral shift, rotation, squat then lumbar extension)  Sidelying: abd 10x  Clam with RTB 3" holds 10x  Prone: POE x  2 min Hip extension 10x  Supine:  Bridge 10x SLR 10x   05/23/22: Sidelying clam 2x 10  Supine: Bridge 15x Hamstring stretch supine with hands behind knee 2x30"  Prone: POE x Hip extension Press up 5x 10"  Standing: scapular retraction rows 10x with RTB, verbal and tactile cueing  05/20/22 Review of HEP and goals Seated: Scapular retractions x 10  Supine: LTR x 10 Glute sets 5" hold x 10 Bridge x 10 Abdominal bracing 5" hold x 10 Abdominal bracing with marching 2 x 10 SKTC 20" hold x 3 Active hamstring stretch 3 x 20"   05/14/22 Eval     PATIENT EDUCATION:  Education details: On Eval findings, POC and HEP  Person educated: Patient Education method: Explanation Education comprehension: verbalized understanding  HOME EXERCISE PROGRAM: Access Code: [email or web address] URL: [email or web address]  06/11/22: GTB postural strengthening shoulder extension and rows   Date: 05/20/2022 Prepared by: [facility] - Rehab 4/30/204: - Standing March with Unilateral Counter Support  - 1 x daily - 7 x weekly - 3 sets - 10 reps - 5" hold - Squat with Chair Touch  - 1 x daily - 7 x weekly - 3 sets - 10 reps  Exercises Access Code: [email or web address] URL: [email or web address] Date: 05/27/2022 Prepared by: Becky Sax  - Prone on Elbows Stretch  - 1 x daily - 7 x weekly - 3 sets - 3 reps - 2' hold - Prone Press Up  - 1 x daily - 7 x weekly - 3 sets - 10 reps - 10" hold - Standing Lumbar Extension  - 1 x daily - 7 x weekly - 3 sets - 10 reps - 5" hold - Clamshell  - 1 x daily - 7 x weekly - 3 sets - 10 reps - 5" hold   - Supine Lower Trunk Rotation  - 2-3 x daily - 7 x weekly - 1 sets - 10 reps - 5 second hold - Hooklying Gluteal Sets  - 2-3 x daily - 7 x weekly - 1 sets - 10 reps - 5 second hold - Seated Scapular Retraction  - 2-3 x daily - 7 x weekly - 1 sets - 10 reps - 5 second hold - Supine Bridge  - 2-3 x daily - 7 x weekly - 1 sets - 10 reps - Supine  Transversus Abdominis Bracing - Hands on Stomach  - 2-3 x daily - 7 x weekly - 1 sets - 10 reps - Supine March  - 2-3 x daily - 7 x weekly - 2 sets - 10 reps - Hooklying Single Knee to Chest Stretch  - 2-3 x daily - 7 x weekly - 1 sets - 3 reps - 20" hold - Supine Hamstring Stretch  -  2-3 x daily - 7 x weekly - 1 sets - 3 reps - 20 sec hold Access Code: [email or web address] URL: [email or web address] Date: 05/14/2022 Prepared by: Georges Lynch  Exercises - Supine Lower Trunk Rotation  - 2-3 x daily - 7 x weekly - 1 sets - 10 reps - 5 second hold - Hooklying Gluteal Sets  - 2-3 x daily - 7 x weekly - 1 sets - 10 reps - 5 second hold - Seated Scapular Retraction  - 2-3 x daily - 7 x weekly - 1 sets - 10 reps - 5 second hold  ASSESSMENT:  CLINICAL IMPRESSION: Progressed core and proximal stability with additional balance associated exercises with intermittent HHA for LOB with new exercises.  Improved mechanics with theraband postural strengthening this session, given GTB and printout to add to HEP.  Did require some cueing for core activation for stability with tasks.  No reports of pain through session.   Eval:Patient is a 72 y.o. female who presents to physical therapy with complaint of LBP. Patient demonstrates muscle weakness, reduced ROM, and fascial restrictions which are likely contributing to symptoms of pain and are negatively impacting patient ability to perform ADLs and functional mobility tasks. Patient will benefit from skilled physical therapy services to address these deficits to reduce pain and improve level of function with ADLs and functional mobility tasks.   OBJECTIVE IMPAIRMENTS: Abnormal gait, decreased activity tolerance, decreased balance, decreased mobility, difficulty walking, decreased ROM, decreased strength, increased fascial restrictions, impaired flexibility, improper body mechanics, and pain.   ACTIVITY LIMITATIONS: carrying, lifting, bending, sitting,  standing, squatting, sleeping, stairs, transfers, bed mobility, and locomotion level  PARTICIPATION LIMITATIONS: meal prep, cleaning, laundry, driving, shopping, community activity, and yard work  PERSONAL FACTORS: Behavior pattern, Past/current experiences, and Time since onset of injury/illness/exacerbation are also affecting patient's functional outcome.   REHAB POTENTIAL: Fair See above  CLINICAL DECISION MAKING: Stable/uncomplicated  EVALUATION COMPLEXITY: Low   GOALS: SHORT TERM GOALS: Target date: 06/04/2022  Patient will be independent with initial HEP and self-management strategies to improve functional outcomes Baseline:  Goal status: IN PROGRESS   LONG TERM GOALS: Target date: 06/25/2022  Patient will be independent with advanced HEP and self-management strategies to improve functional outcomes Baseline:  Goal status: IN PROGRESS  2.  Patient will improve FOTO score to predicted value to indicate improvement in functional outcomes Baseline: 61% function  Goal status: IN PROGRESS  3.  Patient will report at least 50% improvement in subjective complaint to indicate improvement in functional outcomes Baseline:  Goal status: IN PROGRESS  4. Patient will have equal to or > 4+/5 MMT throughout BLE to improve ability to perform functional mobility, stair ambulation and ADLs.  Baseline: See MMT Goal status: IN PROGRESS  PLAN:  PT FREQUENCY: 2x/week  PT DURATION: 6 weeks  PLANNED INTERVENTIONS: Therapeutic exercises, Therapeutic activity, Neuromuscular re-education, Balance training, Gait training, Patient/Family education, Joint manipulation, Joint mobilization, Stair training, Aquatic Therapy, Dry Needling, Electrical stimulation, Spinal manipulation, Spinal mobilization, Cryotherapy, Moist heat, scar mobilization, Taping, Traction, Ultrasound, Biofeedback, Ionotophoresis 4mg /ml Dexamethasone, and Manual therapy. Marland Kitchen  PLAN FOR NEXT SESSION: Progress hip, core and  postural strengthening as tolerated. Progress stability with additional SLS activities.  OT referral if needed for shoulder pain   Becky Sax, LPTA/CLT; CBIS [phone]  12:39 PM, 06/11/22

## 2022-06-16 ENCOUNTER — Encounter (HOSPITAL_COMMUNITY): Payer: Self-pay

## 2022-06-16 ENCOUNTER — Ambulatory Visit (HOSPITAL_COMMUNITY): Payer: 59

## 2022-06-16 DIAGNOSIS — M5459 Other low back pain: Secondary | ICD-10-CM

## 2022-06-16 NOTE — Therapy (Signed)
OUTPATIENT PHYSICAL THERAPY THORACOLUMBAR TREATMENT   Patient Name: Christina Erickson MRN: [id number] DOB:Jan 05, 1951, 72 y.o., female Today's Date: 06/16/2022  END OF SESSION:  PT End of Session - 06/16/22 1112     Visit Number 8    Number of Visits 12    Date for PT Re-Evaluation 06/25/22    Authorization Type UHC dual complete/ Medicaid 2ndary    Progress Note Due on Visit 10    PT Start Time 1039    PT Stop Time 1126    PT Time Calculation (min) 47 min    Activity Tolerance Patient tolerated treatment well    Behavior During Therapy WFL for tasks assessed/performed                  Past Medical History:  Diagnosis Date   Arthritis    Back pain 06/25/2017   High blood pressure    High cholesterol    Pre-diabetes    Past Surgical History:  Procedure Laterality Date   BREAST LUMPECTOMY WITH RADIOACTIVE SEED LOCALIZATION Left 08/20/2021   Procedure: LEFT BREAST RADIOACTIVE SEED LOCALIZED LUMPECTOMY;  Surgeon: Manus Rudd, MD;  Location: [facility];  Service: General;  Laterality: Left;  LMA   CESAREAN SECTION     COLONOSCOPY WITH PROPOFOL N/A 03/22/2021   sigmoid diverticulosis and normal rectum and anal verge.   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   Buttock pain 01/08/2022   Allergic rhinitis due to pollen 07/26/2021   Benign essential hypertension 07/26/2021   High cholesterol 07/26/2021   Peripheral neuropathy 07/26/2021   Type 2 diabetes mellitus (HCC) 07/26/2021   Change in bowel movement 12/06/2020   Abdominal pain 12/06/2020   Patellofemoral arthritis of left knee 03/09/2012    PCP: Coral Ceo  FNP  REFERRING PROVIDER: Vickki Hearing, MD  REFERRING DIAG: M51.36 (ICD-10-CM) - DDD (degenerative disc disease), lumbar  Rationale for Evaluation and Treatment: Rehabilitation  THERAPY DIAG:  Other low back pain  ONSET DATE: Chronic   SUBJECTIVE:                                                                                                                                                                                           SUBJECTIVE STATEMENT: 06/16/22:  No reports of pain in  back, continues to have pain while sitting.  Plans to call MD back concerning hemorrhoid pain.  Has began the new postural strengthening exercises at home without questions  EVAL:Patient presents with complaint of back and shoulder pain. This is chronic. She has has injections and takes prescribed mediation which she say has not helped. Patient also notes she has hemorrhoids and thinks this has something to do with her back pain.   PERTINENT HISTORY:  Chronic LBP, DDD, HTN, DM   PAIN:  Are you having pain? Yes: NPRS scale: 0-4/10 Pain location: low back  Pain description: dull, discomfort Aggravating factors: bending Relieving factors: rest, meds   PRECAUTIONS: None  WEIGHT BEARING RESTRICTIONS: No  FALLS:  Has patient fallen in last 6 months? No  LIVING ENVIRONMENT: Lives with: lives with an adult companion Lives in: Mobile home Stairs: No Has following equipment at home: None  OCCUPATION: Retired  PLOF: Independent  PATIENT GOALS: "relief"   NEXT MD VISIT: "after therapy"   OBJECTIVE:   DIAGNOSTIC FINDINGS:  IMPRESSION: Degenerative disc and facet disease changes lumbar spine with osseous demineralization.   No acute abnormalities.   Aortic Atherosclerosis (ICD10-I70.0).   PATIENT SURVEYS:  FOTO 61% function   COGNITION: Overall cognitive status: Within functional limits for tasks assessed     PALPATION: Min TTP about bilat lumbar paraspinals   LUMBAR ROM:    AROM eval  Flexion WNL  Extension 50% limited  Right lateral flexion 25% Limited  Left lateral flexion 25% Limited  Right rotation 25% Limited  Left rotation 25% Limited   (Blank rows = not tested)   LOWER EXTREMITY MMT:    MMT Right eval Left eval  Hip flexion 4 4+  Hip extension 3- 3-  Hip abduction 3+ 4  Hip adduction    Hip internal rotation    Hip external rotation    Knee flexion    Knee extension 4+ 4+  Ankle dorsiflexion 5 5  Ankle plantarflexion    Ankle inversion    Ankle eversion     (Blank rows = not tested)  LUMBAR SPECIAL TESTS:  Straight leg raise test: Negative bilat  FUNCTIONAL TESTS:  2 minute walk test: 460 feet no AD   GAIT: No AD, decreased stride but normal speed, decreased hip extension bilat  TODAY'S TREATMENT:                                                                                                                              DATE:  06/16/22: Standing: Squat to heel raise 10x Vector stance 5x 5" 1 HHA Tandem stance on foam 2x 30" Bodycraft walk out 2Pl 3RT retro and sidestep Marching with opposite UE/LE 10x 5"  Seated posture education  Pelvic tilts  Educated lumbar support- reports increased neck pain  Standing chin tucks front of wall towel for proprioception GTB shoulder extension- review form for HEP GTB rows- review for HEP form  06/11/22: Standing:  Heel  raises 15x incline slope  Toe raises 15x decline slope  SLS Rt 5" Lt 10" max of 3  Vector stance 3x 5" STS eccentric control 10x  Squat front of chair 10x GTB shoulder rows 2 x 10- HEP GTB shoulder extensions 2 x 10- HEP Tandem stance 2x 30" Bodycraft walk out 2Pl 3RT retro and sidestep 06/09/22 Prone: Press ups 5" hold x 10 Hip extension 2 x 10 each 2# hamstring curls 2 x 10 each  Standing: Heel raises 2 x 10 Hip abduction 2# 2 x 10 Marching 2 # 2 x 10 RTB shoulder rows 2 x 10 RTB shoulder extensions 2 x 10 Slant board 5 x 20" 1# W back x  10       06/03/27: Standing: 3D hip excursion (lateral shift, rotation, squat then lumbar extension) RTB shoulder extension 10x  Rows 10x 5" STS eccentric control 10 no HHA Heel raise with ab set 10x Marching 10x Abduction 10x 3"  Prone: press ups 5x 10" Hip extension 10x  05/27/22: Standing: RTB shoulder extension 10x  Rows 10x 5" 3D hip excursion (lateral shift, rotation, squat then lumbar extension)  Sidelying: abd 10x  Clam with RTB 3" holds 10x  Prone: POE x 2 min Hip extension 10x  Supine:  Bridge 10x SLR 10x   05/23/22: Sidelying clam 2x 10  Supine: Bridge 15x Hamstring stretch supine with hands behind knee 2x30"  Prone: POE x Hip extension Press up 5x 10"  Standing: scapular retraction rows 10x with RTB, verbal and tactile cueing  05/20/22 Review of HEP and goals Seated: Scapular retractions x 10  Supine: LTR x 10 Glute sets 5" hold x 10 Bridge x 10 Abdominal bracing 5" hold x 10 Abdominal bracing with marching 2 x 10 SKTC 20" hold x 3 Active hamstring stretch 3 x 20"   05/14/22 Eval     PATIENT EDUCATION:  Education details: On Eval findings, POC and HEP  Person educated: Patient Education method: Explanation Education comprehension: verbalized understanding  HOME EXERCISE PROGRAM: Access Code: [email or web address] URL: [email or web address]  06/11/22: GTB postural strengthening shoulder extension and rows   Date: 05/20/2022 Prepared by: [facility] - Rehab 4/30/204: - Standing March with Unilateral Counter Support  - 1 x daily - 7 x weekly - 3 sets - 10 reps - 5" hold - Squat with Chair Touch  - 1 x daily - 7 x weekly - 3 sets - 10 reps  Exercises Access Code: [email or web address] URL: [email or web address] Date: 05/27/2022 Prepared by: Becky Sax  - Prone on Elbows Stretch  - 1 x daily - 7 x weekly - 3 sets - 3 reps - 2' hold - Prone Press Up  - 1 x daily - 7 x weekly - 3 sets - 10 reps - 10" hold - Standing  Lumbar Extension  - 1 x daily - 7 x weekly - 3 sets - 10 reps - 5" hold - Clamshell  - 1 x daily - 7 x weekly - 3 sets - 10 reps - 5" hold   - Supine Lower Trunk Rotation  - 2-3 x daily - 7 x weekly - 1 sets - 10 reps - 5 second hold - Hooklying Gluteal Sets  - 2-3 x daily - 7 x weekly - 1 sets - 10 reps - 5 second hold - Seated Scapular Retraction  - 2-3 x daily - 7 x weekly - 1 sets - 10 reps - 5 second hold - Supine Bridge  - 2-3 x daily -  7 x weekly - 1 sets - 10 reps - Supine Transversus Abdominis Bracing - Hands on Stomach  - 2-3 x daily - 7 x weekly - 1 sets - 10 reps - Supine March  - 2-3 x daily - 7 x weekly - 2 sets - 10 reps - Hooklying Single Knee to Chest Stretch  - 2-3 x daily - 7 x weekly - 1 sets - 3 reps - 20" hold - Supine Hamstring Stretch  - 2-3 x daily - 7 x weekly - 1 sets - 3 reps - 20 sec hold Access Code: [email or web address] URL: [email or web address] Date: 05/14/2022 Prepared by: Georges Lynch  Exercises - Supine Lower Trunk Rotation  - 2-3 x daily - 7 x weekly - 1 sets - 10 reps - 5 second hold - Hooklying Gluteal Sets  - 2-3 x daily - 7 x weekly - 1 sets - 10 reps - 5 second hold - Seated Scapular Retraction  - 2-3 x daily - 7 x weekly - 1 sets - 10 reps - 5 second hold  ASSESSMENT:  CLINICAL IMPRESSION: Pt stated she continues to have pain/pressure in seated position.  Educated proper seated posture and use of lumbar support to assist.  Pt stated there is a spot on pelvis or maybe related to hemorrhoids as far as pain.  Continued session focus with core/proximal stability and posture.  Cueing for form and mechanics through session to improve posture with tasks.    Eval:Patient is a 72 y.o. female who presents to physical therapy with complaint of LBP. Patient demonstrates muscle weakness, reduced ROM, and fascial restrictions which are likely contributing to symptoms of pain and are negatively impacting patient ability to perform ADLs and functional  mobility tasks. Patient will benefit from skilled physical therapy services to address these deficits to reduce pain and improve level of function with ADLs and functional mobility tasks.   OBJECTIVE IMPAIRMENTS: Abnormal gait, decreased activity tolerance, decreased balance, decreased mobility, difficulty walking, decreased ROM, decreased strength, increased fascial restrictions, impaired flexibility, improper body mechanics, and pain.   ACTIVITY LIMITATIONS: carrying, lifting, bending, sitting, standing, squatting, sleeping, stairs, transfers, bed mobility, and locomotion level  PARTICIPATION LIMITATIONS: meal prep, cleaning, laundry, driving, shopping, community activity, and yard work  PERSONAL FACTORS: Behavior pattern, Past/current experiences, and Time since onset of injury/illness/exacerbation are also affecting patient's functional outcome.   REHAB POTENTIAL: Fair See above  CLINICAL DECISION MAKING: Stable/uncomplicated  EVALUATION COMPLEXITY: Low   GOALS: SHORT TERM GOALS: Target date: 06/04/2022  Patient will be independent with initial HEP and self-management strategies to improve functional outcomes Baseline:  Goal status: IN PROGRESS   LONG TERM GOALS: Target date: 06/25/2022  Patient will be independent with advanced HEP and self-management strategies to improve functional outcomes Baseline:  Goal status: IN PROGRESS  2.  Patient will improve FOTO score to predicted value to indicate improvement in functional outcomes Baseline: 61% function  Goal status: IN PROGRESS  3.  Patient will report at least 50% improvement in subjective complaint to indicate improvement in functional outcomes Baseline:  Goal status: IN PROGRESS  4. Patient will have equal to or > 4+/5 MMT throughout BLE to improve ability to perform functional mobility, stair ambulation and ADLs.  Baseline: See MMT Goal status: IN PROGRESS  PLAN:  PT FREQUENCY: 2x/week  PT DURATION: 6  weeks  PLANNED INTERVENTIONS: Therapeutic exercises, Therapeutic activity, Neuromuscular re-education, Balance training, Gait training, Patient/Family education, Joint manipulation, Joint mobilization, Stair training, Aquatic Therapy, Dry Needling, Electrical stimulation,  Spinal manipulation, Spinal mobilization, Cryotherapy, Moist heat, scar mobilization, Taping, Traction, Ultrasound, Biofeedback, Ionotophoresis 4mg /ml Dexamethasone, and Manual therapy. Marland Kitchen  PLAN FOR NEXT SESSION: Progress hip, core and postural strengthening as tolerated. Progress stability with additional SLS activities.  OT referral if needed for shoulder pain   Becky Sax, LPTA/CLT; CBIS [phone]  12:27 PM, 06/16/22

## 2022-06-18 ENCOUNTER — Ambulatory Visit (HOSPITAL_COMMUNITY): Payer: 59 | Admitting: Physical Therapy

## 2022-06-18 DIAGNOSIS — M5459 Other low back pain: Secondary | ICD-10-CM

## 2022-06-18 NOTE — Therapy (Signed)
OUTPATIENT PHYSICAL THERAPY THORACOLUMBAR TREATMENT   Patient Name: Christina Erickson MRN: [id number] DOB:1950-05-10, 72 y.o., female Today's Date: 06/18/2022  PHYSICAL THERAPY DISCHARGE SUMMARY  Visits from Start of Care: 9  Current functional level related to goals / functional outcomes: See below   Remaining deficits: See below   Education / Equipment: See assessment    Patient agrees to discharge. Patient goals were met. Patient is being discharged due to meeting the stated rehab goals.  END OF SESSION:  PT End of Session - 06/18/22 0944     Visit Number 9    Number of Visits 12    Date for PT Re-Evaluation 06/25/22    Authorization Type UHC dual complete/ Medicaid 2ndary    Progress Note Due on Visit 10    PT Start Time 0947    PT Stop Time 1010    PT Time Calculation (min) 23 min    Activity Tolerance Patient tolerated treatment well    Behavior During Therapy WFL for tasks assessed/performed              Past Medical History:  Diagnosis Date   Arthritis    Back pain 06/25/2017   High blood pressure    High cholesterol    Pre-diabetes    Past Surgical History:  Procedure Laterality Date   BREAST LUMPECTOMY WITH RADIOACTIVE SEED LOCALIZATION Left 08/20/2021   Procedure: LEFT BREAST RADIOACTIVE SEED LOCALIZED LUMPECTOMY;  Surgeon: Manus Rudd, MD;  Location: [facility];  Service: General;  Laterality: Left;  LMA   CESAREAN SECTION     COLONOSCOPY WITH PROPOFOL N/A 03/22/2021   sigmoid diverticulosis and normal rectum and anal verge.   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   Buttock pain 01/08/2022    Allergic rhinitis due to pollen 07/26/2021   Benign essential hypertension 07/26/2021   High cholesterol 07/26/2021   Peripheral neuropathy 07/26/2021   Type 2 diabetes mellitus (HCC) 07/26/2021   Change in bowel movement 12/06/2020   Abdominal pain 12/06/2020   Patellofemoral arthritis of left knee 03/09/2012    PCP: Coral Ceo FNP  REFERRING PROVIDER: Vickki Hearing, MD  REFERRING DIAG: M51.36 (ICD-10-CM) - DDD (degenerative disc disease), lumbar  Rationale for Evaluation and Treatment: Rehabilitation  THERAPY DIAG:  Other low back pain  ONSET DATE: Chronic   SUBJECTIVE:  SUBJECTIVE STATEMENT: Patient reports symptoms are about the same. She feels pain is most related to issues with hemorrhoids. Back is alright, "feels good".   EVAL:Patient presents with complaint of back and shoulder pain. This is chronic. She has has injections and takes prescribed mediation which she say has not helped. Patient also notes she has hemorrhoids and thinks this has something to do with her back pain.   PERTINENT HISTORY:  Chronic LBP, DDD, HTN, DM   PAIN:  Are you having pain? Yes: NPRS scale: 0/10 Pain location: low back  Pain description: dull, discomfort Aggravating factors: bending Relieving factors: rest, meds   PRECAUTIONS: None  WEIGHT BEARING RESTRICTIONS: No  FALLS:  Has patient fallen in last 6 months? No  LIVING ENVIRONMENT: Lives with: lives with an adult companion Lives in: Mobile home Stairs: No Has following equipment at home: None  OCCUPATION: Retired  PLOF: Independent  PATIENT GOALS: "relief"   NEXT MD VISIT: "after therapy"   OBJECTIVE:   DIAGNOSTIC FINDINGS:  IMPRESSION: Degenerative disc and facet disease changes lumbar spine with osseous demineralization.    No acute abnormalities.   Aortic Atherosclerosis (ICD10-I70.0).   PATIENT SURVEYS:  FOTO 77% (was 61% function)   COGNITION: Overall cognitive status: Within functional limits for tasks assessed     PALPATION: Min TTP about bilat lumbar paraspinals   LUMBAR ROM:   AROM eval 06/18/22  Flexion WNL WNL  Extension 50% limited 25% limited  Right lateral flexion 25% Limited WNL  Left lateral flexion 25% Limited WNL  Right rotation 25% Limited WNL  Left rotation 25% Limited WNL   (Blank rows = not tested)   LOWER EXTREMITY MMT:    MMT Right eval Left eval Right 06/18/22 Left 06/18/22  Hip flexion 4 4+ 5 5  Hip extension 3- 3- 4+ 4+  Hip abduction 3+ 4 4 4+  Hip adduction      Hip internal rotation      Hip external rotation      Knee flexion      Knee extension 4+ 4+ 5 5  Ankle dorsiflexion 5 5 5 5   Ankle plantarflexion      Ankle inversion      Ankle eversion       (Blank rows = not tested)  LUMBAR SPECIAL TESTS:  Straight leg raise test: Negative bilat  FUNCTIONAL TESTS:  2 minute walk test: 460 feet no AD   GAIT: No AD, decreased stride but normal speed, decreased hip extension bilat  TODAY'S TREATMENT:                                                                                                                              DATE:  06/18/22 Reassess  FOTO MMT AROM  HEP review   06/16/22: Standing: Squat to heel raise 10x Vector stance 5x 5" 1 HHA Tandem stance on foam 2x 30" Bodycraft walk out 2Pl 3RT retro and sidestep Marching with opposite  UE/LE 10x 5"  Seated posture education  Pelvic tilts  Educated lumbar support- reports increased neck pain  Standing chin tucks front of wall towel for proprioception GTB shoulder extension- review form for HEP GTB rows- review for HEP form   PATIENT EDUCATION:  Education details: On Eval findings, POC and HEP  Person educated: Patient Education method: Explanation Education comprehension:  verbalized understanding  HOME EXERCISE PROGRAM: Access Code: [email or web address] URL: [email or web address]  06/11/22: GTB postural strengthening shoulder extension and rows   Date: 05/20/2022 Prepared by: [facility] - Rehab 4/30/204: - Standing March with Unilateral Counter Support  - 1 x daily - 7 x weekly - 3 sets - 10 reps - 5" hold - Squat with Chair Touch  - 1 x daily - 7 x weekly - 3 sets - 10 reps  Exercises Access Code: [email or web address] URL: [email or web address] Date: 05/27/2022 Prepared by: Becky Sax  - Prone on Elbows Stretch  - 1 x daily - 7 x weekly - 3 sets - 3 reps - 2' hold - Prone Press Up  - 1 x daily - 7 x weekly - 3 sets - 10 reps - 10" hold - Standing Lumbar Extension  - 1 x daily - 7 x weekly - 3 sets - 10 reps - 5" hold - Clamshell  - 1 x daily - 7 x weekly - 3 sets - 10 reps - 5" hold   - Supine Lower Trunk Rotation  - 2-3 x daily - 7 x weekly - 1 sets - 10 reps - 5 second hold - Hooklying Gluteal Sets  - 2-3 x daily - 7 x weekly - 1 sets - 10 reps - 5 second hold - Seated Scapular Retraction  - 2-3 x daily - 7 x weekly - 1 sets - 10 reps - 5 second hold - Supine Bridge  - 2-3 x daily - 7 x weekly - 1 sets - 10 reps - Supine Transversus Abdominis Bracing - Hands on Stomach  - 2-3 x daily - 7 x weekly - 1 sets - 10 reps - Supine March  - 2-3 x daily - 7 x weekly - 2 sets - 10 reps - Hooklying Single Knee to Chest Stretch  - 2-3 x daily - 7 x weekly - 1 sets - 3 reps - 20" hold - Supine Hamstring Stretch  - 2-3 x daily - 7 x weekly - 1 sets - 3 reps - 20 sec hold Access Code: [email or web address] URL: [email or web address] Date: 05/14/2022 Prepared by: Georges Lynch  Exercises - Supine Lower Trunk Rotation  - 2-3 x daily - 7 x weekly - 1 sets - 10 reps - 5 second hold - Hooklying Gluteal Sets  - 2-3 x daily - 7 x weekly - 1 sets - 10 reps - 5 second hold - Seated Scapular Retraction  - 2-3 x daily - 7 x weekly - 1 sets - 10 reps - 5 second  hold  ASSESSMENT:  CLINICAL IMPRESSION: Patient has made good progress and is currently with all therapy goals met. Patient showing improved lumbar ROM as well as LE and core strength. Patient reports regular compliance with HEP and has no questions at this time. Patient will be DC today due to all goals met. Encouraged patient to follow up with therapy services with any further questions or concerns.   OBJECTIVE IMPAIRMENTS: Abnormal gait, decreased activity tolerance, decreased balance, decreased mobility, difficulty walking, decreased ROM, decreased strength, increased fascial restrictions, impaired flexibility, improper body mechanics, and pain.  ACTIVITY LIMITATIONS: carrying, lifting, bending, sitting, standing, squatting, sleeping, stairs, transfers, bed mobility, and locomotion level  PARTICIPATION LIMITATIONS: meal prep, cleaning, laundry, driving, shopping, community activity, and yard work  PERSONAL FACTORS: Behavior pattern, Past/current experiences, and Time since onset of injury/illness/exacerbation are also affecting patient's functional outcome.   REHAB POTENTIAL: Fair See above  CLINICAL DECISION MAKING: Stable/uncomplicated  EVALUATION COMPLEXITY: Low   GOALS: SHORT TERM GOALS: Target date: 06/04/2022  Patient will be independent with initial HEP and self-management strategies to improve functional outcomes Baseline:  Goal status: MET   LONG TERM GOALS: Target date: 06/25/2022  Patient will be independent with advanced HEP and self-management strategies to improve functional outcomes Baseline:  Goal status: MET  2.  Patient will improve FOTO score to predicted value to indicate improvement in functional outcomes Baseline: 77% function  Goal status: MET  3.  Patient will report at least 50% improvement in subjective complaint to indicate improvement in functional outcomes Baseline: 80%  Goal status: MET  4. Patient will have equal to or > 4+/5 MMT throughout  BLE to improve ability to perform functional mobility, stair ambulation and ADLs.  Baseline: See MMT Goal status: Partially MET (all except hip abduction)   PLAN:  PT FREQUENCY: 2x/week  PT DURATION: 6 weeks  PLANNED INTERVENTIONS: Therapeutic exercises, Therapeutic activity, Neuromuscular re-education, Balance training, Gait training, Patient/Family education, Joint manipulation, Joint mobilization, Stair training, Aquatic Therapy, Dry Needling, Electrical stimulation, Spinal manipulation, Spinal mobilization, Cryotherapy, Moist heat, scar mobilization, Taping, Traction, Ultrasound, Biofeedback, Ionotophoresis 4mg /ml Dexamethasone, and Manual therapy. Marland Kitchen  PLAN FOR NEXT SESSION: DC to HEP   10:09 AM, 06/18/22 Georges Lynch PT DPT  Physical Therapist with [facility]  [phone]

## 2022-07-02 ENCOUNTER — Encounter: Payer: Self-pay | Admitting: Gastroenterology

## 2022-07-02 ENCOUNTER — Ambulatory Visit (INDEPENDENT_AMBULATORY_CARE_PROVIDER_SITE_OTHER): Payer: 59 | Admitting: Gastroenterology

## 2022-07-02 VITALS — BP 136/67 | HR 71 | Temp 97.5°F | Ht 62.5 in | Wt 137.1 lb

## 2022-07-02 DIAGNOSIS — K6289 Other specified diseases of anus and rectum: Secondary | ICD-10-CM

## 2022-07-02 MED ORDER — AMITRIPTYLINE HCL 10 MG PO TABS: 10.0000 mg | ORAL_TABLET | Freq: Every day | ORAL | 1 refills | Status: DC

## 2022-07-02 NOTE — Progress Notes (Addendum)
Gastroenterology Office Note     Primary Care Physician:  Wilmon Pali, FNP  Primary Gastroenterologist: Dr. Levon Hedger    Chief Complaint   Chief Complaint  Patient presents with   Rectal Pain    Patient here today due to issues with rectal pain. Patient says she has had three hemorrhoid bandings, and she is still having the pain. Patient says the only relief she has is when she is sitting on the toilet. She says she has tried the donut pillow and this has not helped. She says she had been using the [employer] cream,but she has since ran out of this, but did not see any benefit to it when she was using it.      History of Present Illness   Christina Erickson is a 72 y.o. female with a history of back pain, hyperlipidemia, hypertension, prediabetes, who presents for follow up of chronic rectal discomfort, reported as radiating to her throat and eyes. Thorough evaluation thus far including compounded cream for possible fissure, flex sig with internal hemorrhoids, CT abd/pelvis in Oct 2023 without any concerning pathologies, hemorrhoid banding X 3, and recommended Elavil 10 mg each evening. She was felt to have a functional etiology.   She is currently not taking Elavil. She is not sure how long she took this. Notes no improvement since hemorrhoid banding. Notes pain inside of rectum when sitting down, but this radiates "into my neck and chest".   Standing, goes away. Sitting worsens.  At times feels discomfort in perineum but no numbness. Woke up in pain last night. Laying down causes pain. BM twice a a day. No pain with BMs. Will be seeing Dr. Romeo Apple in the near future. She has been undergoing PT therapy and has completed the sessions.   Last Colonoscopy: 03/22/2021, diverticulosis in the sigmoid colon.  Advised to repeat colonoscopy in 10 years.    Past Medical History:  Diagnosis Date   Arthritis    Back pain 06/25/2017   High blood pressure    High cholesterol     Pre-diabetes     Past Surgical History:  Procedure Laterality Date   BREAST LUMPECTOMY WITH RADIOACTIVE SEED LOCALIZATION Left 08/20/2021   Procedure: LEFT BREAST RADIOACTIVE SEED LOCALIZED LUMPECTOMY;  Surgeon: Manus Rudd, MD;  Location: [facility] OR;  Service: General;  Laterality: Left;  LMA   CESAREAN SECTION     COLONOSCOPY WITH PROPOFOL N/A 03/22/2021   sigmoid diverticulosis and normal rectum and anal verge.   FLEXIBLE SIGMOIDOSCOPY N/A 04/08/2022   Procedure: FLEXIBLE SIGMOIDOSCOPY;  Surgeon: Dolores Frame, MD;  Location: [facility];  Service: Gastroenterology;  Laterality: N/A;  asa 1-2   IR FLUORO GUIDED NEEDLE PLC ASPIRATION/INJECTION LOC  06/25/2017   TUBAL LIGATION      Current Outpatient Medications  Medication Sig Dispense Refill   acetaminophen (TYLENOL) 650 MG CR tablet Take 650 mg by mouth at bedtime.     albuterol (VENTOLIN HFA) 108 (90 Base) MCG/ACT inhaler Inhale 1-2 puffs into the lungs every 6 (six) hours as needed for wheezing or shortness of breath.     amLODipine (NORVASC) 10 MG tablet Take 10 mg by mouth in the morning.     benazepril (LOTENSIN) 40 MG tablet Take 40 mg by mouth in the morning.     Calcium Carb-Cholecalciferol (CALCIUM 600 + D PO) Take 1 tablet by mouth daily.     Cholecalciferol (VITAMIN D) 50 MCG (2000 UT) tablet Take 2,000 Units by mouth  daily.     fenofibrate (TRICOR) 48 MG tablet Take 48 mg by mouth daily.     ibuprofen (ADVIL) 800 MG tablet Take 1 tablet (800 mg total) by mouth every 8 (eight) hours as needed. 90 tablet 1   methocarbamol (ROBAXIN) 500 MG tablet TAKE ONE TABLET BY MOUTH THREE TIMES DAILY (Patient not taking: Reported on 07/02/2022) 60 tablet 1   No current facility-administered medications for this visit.    Allergies as of 07/02/2022 - Review Complete 07/02/2022  Allergen Reaction Noted   Naproxen Nausea Only 06/25/2017   Ezetimibe Other (See Comments) 01/02/2021   Statins Other (See Comments) 01/02/2021     Family History  Problem Relation Age of Onset   Heart disease Other    Arthritis Other    Cancer Other    Diabetes Other     Social History   Socioeconomic History   Marital status: Widowed    Spouse name: Not on file   Number of children: Not on file   Years of education: Not on file   Highest education level: Not on file  Occupational History   Not on file  Tobacco Use   Smoking status: Every Day    Packs/day: 0.25    Years: 50.00    Additional pack years: 0.00    Total pack years: 12.50    Types: Cigarettes   Smokeless tobacco: Never   Tobacco comments:    encouraged to quit, declined smoking cessation materials  Vaping Use   Vaping Use: Never used  Substance and Sexual Activity   Alcohol use: No   Drug use: Not Currently   Sexual activity: Not Currently    Birth control/protection: Post-menopausal  Other Topics Concern   Not on file  Social History Narrative   Not on file   Social Determinants of Health   Financial Resource Strain: Low Risk  (10/15/2021)   Overall Financial Resource Strain (CARDIA)    Difficulty of Paying Living Expenses: Not hard at all  Food Insecurity: No Food Insecurity (10/15/2021)   Hunger Vital Sign    Worried About Running Out of Food in the Last Year: Never true    Ran Out of Food in the Last Year: Never true  Transportation Needs: No Transportation Needs (10/15/2021)   PRAPARE - Administrator, Civil Service (Medical): No    Lack of Transportation (Non-Medical): No  Physical Activity: Inactive (10/15/2021)   Exercise Vital Sign    Days of Exercise per Week: 0 days    Minutes of Exercise per Session: 0 min  Stress: No Stress Concern Present (10/15/2021)   [employer] of Occupational Health - Occupational Stress Questionnaire    Feeling of Stress : Only a little  Social Connections: Unknown (10/15/2021)   Social Connection and Isolation Panel [NHANES]    Frequency of Communication with Friends and Family: More  than three times a week    Frequency of Social Gatherings with Friends and Family: More than three times a week    Attends Religious Services: Patient declined    Database administrator or Organizations: No    Attends Banker Meetings: Patient declined    Marital Status: Widowed  Intimate Partner Violence: Not At Risk (10/15/2021)   Humiliation, Afraid, Rape, and Kick questionnaire    Fear of Current or Ex-Partner: No    Emotionally Abused: No    Physically Abused: No    Sexually Abused: No     Review of Systems  Gen: Denies any fever, chills, fatigue, weight loss, lack of appetite.  CV: Denies chest pain, heart palpitations, peripheral edema, syncope.  Resp: Denies shortness of breath at rest or with exertion. Denies wheezing or cough.  GI: Denies dysphagia or odynophagia. Denies jaundice, hematemesis, fecal incontinence. GU : Denies urinary burning, urinary frequency, urinary hesitancy MS: Denies joint pain, muscle weakness, cramps, or limitation of movement.  Derm: Denies rash, itching, dry skin Psych: Denies depression, anxiety, memory loss, and confusion Heme: Denies bruising, bleeding, and enlarged lymph nodes.   Physical Exam   BP 136/67 (BP Location: Right Arm, Patient Position: Standing, Cuff Size: Normal)   Pulse 71   Temp (!) 97.5 F (36.4 C) (Temporal)   Ht 5' 2.5" (1.588 m)   Wt 137 lb 1.6 oz (62.2 kg)   BMI 24.68 kg/m  General:   Alert and oriented. Pleasant and cooperative. Well-nourished and well-developed.  Head:  Normocephalic and atraumatic. Eyes:  Without icterus Abdomen:  +BS, soft, non-tender and non-distended. No HSM noted. No guarding or rebound. No masses appreciated.  Rectal:  Deferred  Msk:  Symmetrical without gross deformities. Normal posture. Extremities:  Without edema. Neurologic:  Alert and  oriented x4;  grossly normal neurologically. Skin:  Intact without significant lesions or rashes. Psych:  Alert and cooperative. Normal  mood and affect.   Assessment   ARMONII MARKEL is a 72 y.o. female presenting today in follow-up with a history of back pain, hyperlipidemia, hypertension, prediabetes, who presents for follow up of chronic rectal discomfort, reported as radiating to her throat and eyes.   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 I am curious about a musculoskeletal etiology and most likely has functional component as well. Will resume Elavil 10 mg each evening; this can be titrated up as tolerated. The final piece of evaluation that would conclude GI work-up would be MRI pelvis. She mentions a possible upcoming MRI of spine; I will reach out to Dr. Romeo Apple. If she is to have an MRI of the spine, would favor doing both at same time to help group care. PLAN    Restart amitriptyline 10 mg each evening. We certainly have room to titrate this up if needed Keep appt with Dr. Romeo Apple on Friday I am reaching out to Dr. Romeo Apple regarding further steps: I would like an MRI pelvis to wrap up this GI evaluation. I suspect this is more of a functional, possible musculoskeletal etiology. If she needs MRI lumbar spine, would be ideal to have MRI pelvis at same time if possible 6 week return   Christina Mink, PhD, ANP-BC [facility] Gastroenterology   I have reviewed the note and agree with the APP's assessment as described in this progress note  Christina Blazing, MD Gastroenterology and Hepatology [facility] Gastroenterology

## 2022-07-02 NOTE — Patient Instructions (Addendum)
I would like you to restart the amitriptyline one tablet each evening at bedtime.  I am reaching out to Dr. Romeo Apple. Please keep the appointment for Friday!  We have room to increase amitriptyline in the future, and it may take awhile to take effect.  We will see you back in 6 weeks!  I enjoyed seeing you again today! I value our relationship and want to provide genuine, compassionate, and quality care. You may receive a survey regarding your visit with me, and I welcome your feedback! Thanks so much for taking the time to complete this. I look forward to seeing you again.      Gelene Mink, PhD, ANP-BC [facility] Gastroenterology

## 2022-07-04 ENCOUNTER — Ambulatory Visit (INDEPENDENT_AMBULATORY_CARE_PROVIDER_SITE_OTHER): Payer: 59 | Admitting: Orthopedic Surgery

## 2022-07-04 ENCOUNTER — Encounter: Payer: Self-pay | Admitting: Orthopedic Surgery

## 2022-07-04 ENCOUNTER — Telehealth: Payer: Self-pay | Admitting: Orthopedic Surgery

## 2022-07-04 VITALS — BP 142/65 | HR 71 | Ht 62.5 in | Wt 138.5 lb

## 2022-07-04 DIAGNOSIS — M5136 Other intervertebral disc degeneration, lumbar region: Secondary | ICD-10-CM | POA: Diagnosis not present

## 2022-07-04 MED ORDER — INDOMETHACIN 25 MG PO CAPS: 25.0000 mg | ORAL_CAPSULE | Freq: Two times a day (BID) | ORAL | 0 refills | Status: AC

## 2022-07-04 NOTE — Addendum Note (Signed)
Addended by: Michaele Offer on: 07/04/2022 09:32 AM   Modules accepted: Orders

## 2022-07-04 NOTE — Telephone Encounter (Signed)
Dr. Romeo Apple Patient called at 10:42 lvm stating she has made her MRI appt and it is 07/08/22 at 11:00 am.

## 2022-07-04 NOTE — Progress Notes (Signed)
   This is a follow-up appointment  Encounter Diagnosis  Name Primary?   DDD (degenerative disc disease), lumbar Yes     Chief Complaint  Patient presents with   Back Pain    It still hurts.    72 year old female presents to [place] after physical therapy for degenerative disc disease and lower back pain  Her previous treatments have included ibuprofen tizanidine physical therapy  Her pain is bilateral lower back radiates to the knees and occasionally into the feet her pain increases in the supine position and she has trouble sitting and driving the pain is relieved with standing  She had no red flags on evaluation  Her imaging studies showed facet arthritis and degenerative disc disease without deformity  She completed her physical therapy the change to Robaxin did not help  Recommendations now are for her to have an MRI and see orthospine  I would stop her ibuprofen at this point and put her on indocin  twice a day recommend heating pad activity modification

## 2022-07-04 NOTE — Patient Instructions (Addendum)
You will be sent for an MRI to further evaluate your back pain    After that MRI I will call you with the results and make an appointment for you to see our back specialist   In the meantime please avoid activities that increase the back pain   Stop taking the ibuprofen and start the indocin twice a day   Use a heating pad as needed to help with pain  Follow the smoking cessation guidelines below if you are smoker  Smoking Tobacco Information, Adult Smoking tobacco can be harmful to your health. Tobacco contains a toxic colorless chemical called nicotine. Nicotine causes changes in your brain that make you want more and more. This is called addiction. This can make it hard to stop smoking once you start. Tobacco also has other toxic chemicals that can hurt your body and raise your risk of many cancers. Menthol or "lite" tobacco or cigarette brands are not safer than regular brands. How can smoking tobacco affect me? Smoking tobacco puts you at risk for: Cancer. Smoking is most commonly associated with lung cancer, but can also lead to cancer in other parts of the body. Chronic obstructive pulmonary disease (COPD). This is a long-term lung condition that makes it hard to breathe. It also gets worse over time. High blood pressure (hypertension), heart disease, stroke, heart attack, and lung infections, such as pneumonia. Cataracts. This is when the lenses in the eyes become clouded. Digestive problems. This may include peptic ulcers, heartburn, and gastroesophageal reflux disease (GERD). Oral health problems, such as gum disease, mouth sores, and tooth loss. Loss of taste and smell. Smoking also affects how you look and smell. Smoking may cause: Wrinkles. Yellow or stained teeth, fingers, and fingernails. Bad breath. Bad-smelling clothes and hair. Smoking tobacco can also affect your social life, because: It may be challenging to find places to smoke when away from home. Many workplaces,  [employer], hotels, and public places are tobacco-free. Smoking is expensive. This is due to the cost of tobacco and the long-term costs of treating health problems from smoking. Secondhand smoke may affect those around you. Secondhand smoke can cause lung cancer, breathing problems, and heart disease. Children of smokers have a higher risk for: Sudden infant death syndrome (SIDS). Ear infections. Lung infections. What actions can I take to prevent health problems? Quit smoking  Do not start smoking. Quit if you already smoke. Do not replace cigarette smoking with vaping devices, such as e-cigarettes. Make a plan to quit smoking and commit to it. Look for programs to help you, and ask your health care provider for recommendations and ideas. Set a date and write down all the reasons you want to quit. Let your friends and family know you are quitting so they can help and support you. Consider finding friends who also want to quit. It can be easier to quit with someone else, so that you can support each other. Talk with your health care provider about using nicotine replacement medicines to help you quit. These include gum, lozenges, patches, sprays, or pills. If you try to quit but return to smoking, stay positive. It is common to slip up when you first quit, so take it one day at a time. Be prepared for cravings. When you feel the urge to smoke, chew gum or suck on hard candy. Lifestyle Stay busy. Take care of your body. Get plenty of exercise, eat a healthy diet, and drink plenty of water. Find ways to manage your stress,  such as meditation, yoga, exercise, or time spent with friends and family. Ask your health care provider about having regular tests (screenings) to check for cancer. This may include blood tests, imaging tests, and other tests. Where to find support To get support to quit smoking, consider: Asking your health care provider for more information and resources. Joining a  support group for people who want to quit smoking in your local community. There are many effective programs that may help you to quit. Calling the smokefree.gov counselor helpline at 1-800-QUIT-NOW 843 463 0681). Where to find more information You may find more information about quitting smoking from: Centers for Disease Control and Prevention: http://www.osborne.com/ [employer]: smokefree.gov American Lung Association: freedomfromsmoking.org Contact a health care provider if: You have problems breathing. Your lips, nose, or fingers turn blue. You have chest pain. You are coughing up blood. You feel like you will faint. You have other health changes that cause you to worry. Summary Smoking tobacco can negatively affect your health, the health of those around you, your finances, and your social life. Do not start smoking. Quit if you already smoke. If you need help quitting, ask your health care provider. Consider joining a support group for people in your local community who want to quit smoking. There are many effective programs that may help you to quit. This information is not intended to replace advice given to you by your health care provider. Make sure you discuss any questions you have with your health care provider. Document Revised: 01/15/2021 Document Reviewed: 01/15/2021 Elsevier Patient Education  2024 [employer].

## 2022-07-07 ENCOUNTER — Telehealth: Payer: Self-pay | Admitting: Orthopedic Surgery

## 2022-07-07 NOTE — Telephone Encounter (Signed)
LVM for the patient to call us back to schedule her MRI review appointment.

## 2022-07-08 ENCOUNTER — Ambulatory Visit (HOSPITAL_COMMUNITY)
Admission: RE | Admit: 2022-07-08 | Discharge: 2022-07-08 | Disposition: A | Discharge status: Home / Self Care | Payer: 59 | Source: Ambulatory Visit | Attending: Orthopedic Surgery | Admitting: Orthopedic Surgery

## 2022-07-08 DIAGNOSIS — M5136 Other intervertebral disc degeneration, lumbar region: Secondary | ICD-10-CM | POA: Diagnosis present

## 2022-07-11 ENCOUNTER — Ambulatory Visit (HOSPITAL_COMMUNITY): Payer: 59

## 2022-07-15 ENCOUNTER — Ambulatory Visit: Payer: 59 | Admitting: Orthopaedic Surgery

## 2022-07-16 ENCOUNTER — Other Ambulatory Visit: Payer: Self-pay | Admitting: Orthopedic Surgery

## 2022-07-16 DIAGNOSIS — M5136 Other intervertebral disc degeneration, lumbar region: Secondary | ICD-10-CM

## 2022-07-17 ENCOUNTER — Ambulatory Visit (INDEPENDENT_AMBULATORY_CARE_PROVIDER_SITE_OTHER): Payer: 59 | Admitting: Gastroenterology

## 2022-07-18 ENCOUNTER — Encounter: Payer: Self-pay | Admitting: Orthopedic Surgery

## 2022-07-18 ENCOUNTER — Ambulatory Visit (INDEPENDENT_AMBULATORY_CARE_PROVIDER_SITE_OTHER): Payer: 59 | Admitting: Orthopedic Surgery

## 2022-07-18 VITALS — BP 132/62 | HR 76 | Ht 62.5 in | Wt 138.0 lb

## 2022-07-18 DIAGNOSIS — M4316 Spondylolisthesis, lumbar region: Secondary | ICD-10-CM | POA: Diagnosis not present

## 2022-07-18 DIAGNOSIS — M5136 Other intervertebral disc degeneration, lumbar region: Secondary | ICD-10-CM | POA: Diagnosis not present

## 2022-07-18 NOTE — Patient Instructions (Signed)
We are referring you to [facility] Office address is [address] [place] The phone number is [phone]  The office will call you with an appointment Dr. Moore   

## 2022-07-18 NOTE — Progress Notes (Signed)
   This is a follow-up appointment  Encounter Diagnoses  Name Primary?   DDD (degenerative disc disease), lumbar Yes   Spondylolisthesis, lumbar region      Chief Complaint  Patient presents with   Back Pain   Results    Review MRI    72 year old female treated for degenerative disc disease with physical therapy tizanidine ibuprofen Robaxin INDOCIN, heating pad activity modification  She experiences pain with sitting more than standing therapy and modifications have not seemed to improve her symptoms  My Image interpretation:  Multilevel degenerative disc disease mild spondylolisthesis facet arthritis  IMAGING:   There is disc desiccation throughout the lumbar spine with mild loss of height at L2-L3, similar to 2019.   T12-L1: No significant spinal canal or neural foraminal stenosis   L1-L2: Minimal facet arthropathy without significant spinal canal or neural foraminal stenosis.   L2-L3: There is a disc bulge with a superimposed small left subarticular zone protrusion with slight inferior migration (14-10), mild endplate spurring, and minimal facet arthropathy resulting in mild left subarticular zone narrowing without evidence of nerve root impingement, and no significant neural foraminal stenosis. The left subarticular zone narrowing appears improved since 2019.   L3-L4: There is trace anterolisthesis with a mild disc bulge and moderate bilateral facet arthropathy but no significant spinal canal or neural foraminal stenosis. The facet arthropathy is slightly progressed since 2019.   L4-L5: There is a mild disc bulge and moderate left worse than right facet arthropathy but no significant spinal canal or neural foraminal stenosis. The facet arthropathy is slightly progressed since 2019.   L5-S1: There is advanced right and moderate left facet arthropathy without significant spinal canal or neural foraminal stenosis. The facet arthropathy appears slightly  progressed since 2019.   IMPRESSION: 1. Moderate to advanced facet arthropathy at L3-L4 through L5-S1, overall slightly progressed since 2019. 2. Disc bulge with a superimposed small left subarticular zone protrusion with slight inferior migration at L2-L3 resulting in mild left subarticular zone narrowing without evidence of nerve root impingement, improved since 2019. 3. No significant spinal canal stenosis, neural foraminal stenosis, or evidence of nerve root impingement at any level. 4. Mild dextrocurvature and trace anterolisthesis of L3 on L4 are similar to 2019.     Electronically Signed   By: Lesia Hausen M.D.   On: 07/17/2022 16:06  Images explained to the patient  Recommendation is for referral to Dr. Christell Constant for definitive management

## 2022-07-31 ENCOUNTER — Other Ambulatory Visit: Payer: Self-pay | Admitting: Gastroenterology

## 2022-08-14 ENCOUNTER — Ambulatory Visit: Payer: 59 | Admitting: Gastroenterology

## 2022-08-15 ENCOUNTER — Ambulatory Visit (INDEPENDENT_AMBULATORY_CARE_PROVIDER_SITE_OTHER): Payer: 59 | Admitting: Orthopedic Surgery

## 2022-08-15 VITALS — BP 136/75 | HR 73 | Ht 62.5 in | Wt 138.0 lb

## 2022-08-15 DIAGNOSIS — M545 Low back pain, unspecified: Secondary | ICD-10-CM

## 2022-08-15 DIAGNOSIS — G8929 Other chronic pain: Secondary | ICD-10-CM

## 2022-08-15 MED ORDER — CYCLOBENZAPRINE HCL 10 MG PO TABS: 10.0000 mg | ORAL_TABLET | Freq: Three times a day (TID) | ORAL | 0 refills | Status: AC | PRN

## 2022-08-15 NOTE — Progress Notes (Signed)
Orthopedic Spine Surgery Office Note  Assessment: Patient is a 72 y.o. female with chronic progressive low back pain with no radiculopathy   Plan: -Explained that initially conservative treatment is tried as a significant number of patients may experience relief with these treatment modalities. Discussed that the conservative treatments include:  -activity modification  -physical therapy  -over the counter pain medications  -medrol dosepak  -lumbar steroid injections -Patient has tried PT, Tylenol, tizanidine, indomethacin, heating pad  -Prescribed Flexeril to try -Told her that there is no significant stenosis on her MRI or instability so I did not see a surgery that would provide her with benefit but would expose her to potential for complication and risk.  Accordingly, recommended pain management.  Referral was provided to her today. -Would need to be nicotine free prior to any elective spine surgery -Patient should return to office on an as-needed basis   Patient expressed understanding of the plan and all questions were answered to the patient's satisfaction.   ___________________________________________________________________________   History:  Patient is a 72 y.o. female who presents today for lumbar spine.  Patient has had low back pain for several years now that has gotten progressively worse with time.  She says she feels the pain with sitting and standing.  It does get a little better if she lays down.  She rates the pain as a 9 out of 10 on average.  She has tried multiple conservative treatments but has not noticed any significant relief.  The pain does not radiate into either upper extremity or lower extremity.  She says it radiates up her spine and sometimes into her throat and eyes.  There is no trauma or injury that preceded the onset of pain.   Weakness: Denies Symptoms of imbalance: Denies Paresthesias and numbness: Denies Bowel or bladder incontinence:  Denies Saddle anesthesia: Denies  Treatments tried: PT, Tylenol, tizanidine, indomethacin, heating pad  Review of systems: Denies fevers and chills, night sweats, unexplained weight loss, history of cancer, pain that wakes her at night  Past medical history: HTN HLD Pre-diabetes  Allergies: Naproxen, ezetimibe, statins  Past surgical history:  Breast lumpectomy C section Tubal ligation  Social history: Reports use of nicotine product (smoking, vaping, patches, smokeless) Alcohol use: denies Denies recreational drug use   Physical Exam:  General: no acute distress, appears stated age Neurologic: alert, answering questions appropriately, following commands Respiratory: unlabored breathing on room air, symmetric chest rise Psychiatric: appropriate affect, normal cadence to speech   MSK (spine):  -Strength exam      Left  Right EHL    5/5  5/5 TA    5/5  5/5 GSC    5/5  5/5 Knee extension  5/5  5/5 Hip flexion   5/5  5/5  -Sensory exam    Sensation intact to light touch in L3-S1 nerve distributions of bilateral lower extremities  -Straight leg raise: Negative bilaterally -Femoral nerve stretch test: Negative bilaterally -Clonus: no beats bilaterally -Negative Romberg  -Left hip exam: No pain through range of motion, negative Stinchfield, negative FABER -Right hip exam: No pain through range of motion, negative Stinchfield, negative FABER  Imaging: XR of the lumbar spine from 01/09/2023 was independently reviewed and interpreted, showing no significant degenerative changes. No evidence of instability on flexion/extension views. No fracture or dislocation seen.   MRI of the lumbar spine from 07/08/2022 was independently reviewed and interpreted, showing no significant stenosis. Mild DDD at L2/3 and L3/4.  Small noncompressive disc herniation at  L2/3.   Patient name: Christina Erickson Patient MRN: [id number] Date of visit: 08/15/22

## 2022-10-08 ENCOUNTER — Other Ambulatory Visit (HOSPITAL_COMMUNITY): Payer: Self-pay | Admitting: Family Medicine

## 2022-10-08 DIAGNOSIS — Z1231 Encounter for screening mammogram for malignant neoplasm of breast: Secondary | ICD-10-CM

## 2022-10-09 ENCOUNTER — Encounter (HOSPITAL_COMMUNITY): Payer: Self-pay | Admitting: Internal Medicine

## 2022-10-16 ENCOUNTER — Encounter (HOSPITAL_COMMUNITY): Payer: Self-pay

## 2022-10-16 ENCOUNTER — Ambulatory Visit (HOSPITAL_COMMUNITY)
Admission: RE | Admit: 2022-10-16 | Discharge: 2022-10-16 | Disposition: A | Discharge status: Home / Self Care | Payer: 59 | Source: Ambulatory Visit | Attending: Family Medicine | Admitting: Family Medicine

## 2022-10-16 DIAGNOSIS — Z1231 Encounter for screening mammogram for malignant neoplasm of breast: Secondary | ICD-10-CM | POA: Diagnosis present

## 2023-01-16 ENCOUNTER — Other Ambulatory Visit: Payer: Self-pay | Admitting: Gastroenterology

## 2023-02-12 ENCOUNTER — Other Ambulatory Visit: Payer: Self-pay | Admitting: Gastroenterology

## 2023-04-23 ENCOUNTER — Other Ambulatory Visit: Payer: Self-pay | Admitting: Gastroenterology

## 2023-07-13 ENCOUNTER — Other Ambulatory Visit: Payer: Self-pay | Admitting: Gastroenterology

## 2023-07-13 NOTE — Telephone Encounter (Signed)
 Phoned the pt and asked her did she call / need the Rx for Amitriptyline  10 mg refilled.  The pt stated she didn't call for a refill. I asked the pt does she need this Rx refilled. The pt stated no. Will refuse all and send back to the pharmacy regarding this medication.

## 2023-07-15 ENCOUNTER — Other Ambulatory Visit: Payer: Self-pay | Admitting: Gastroenterology

## 2023-07-23 IMAGING — CT CT ABD-PELV W/ CM
2 of 5 series · 16 of 46 positions shown, 18 images · IV contrast (Omnipaque or Isovue)
Comparison: None.

CLINICAL DATA: Abdominal pain, acute (Ped 0-17y)

EXAM:
CT ABDOMEN AND PELVIS WITH CONTRAST
TECHNIQUE: Multidetector CT imaging of the abdomen and pelvis was performed
using the standard protocol following bolus administration of
intravenous contrast.
CONTRAST:  100mL OMNIPAQUE IOHEXOL 300 MG/ML  SOLN

[Series 2: axial st · axial · 0.75mm/px · z∈[+961,+1306]mm · 13 of 79 slices shown, 15 images]
[im 5/79  soft-tissue]
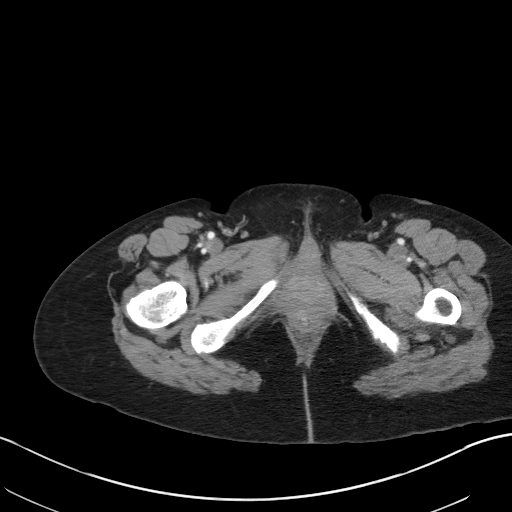
[im 5/79  bone]
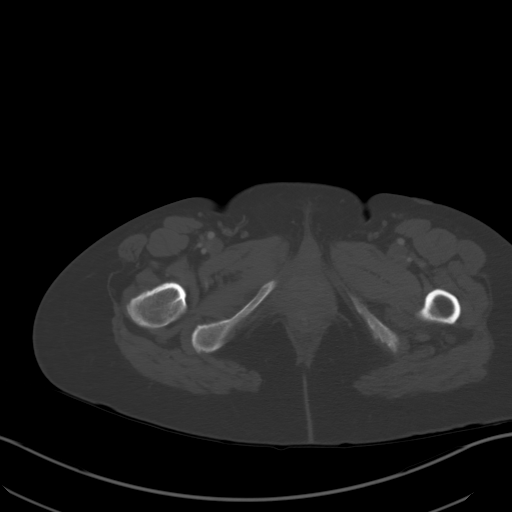
[im 10/79  soft-tissue]
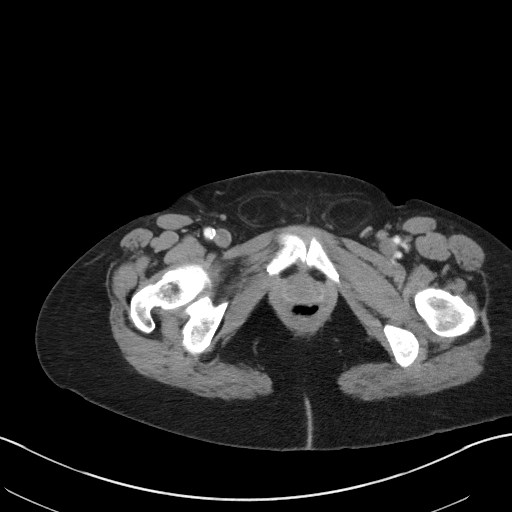
[im 19/79  soft-tissue]
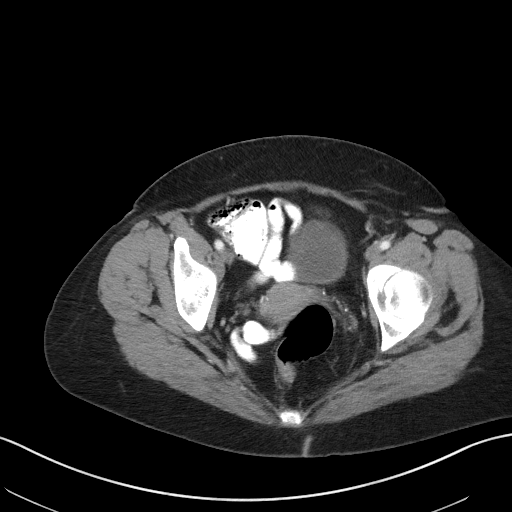
[im 23/79  soft-tissue]
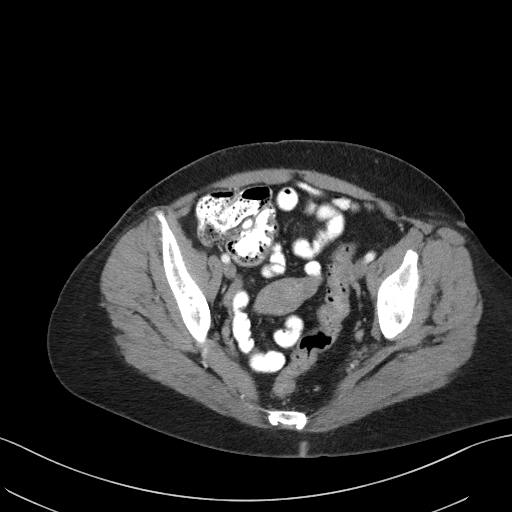
[im 28/79  soft-tissue]
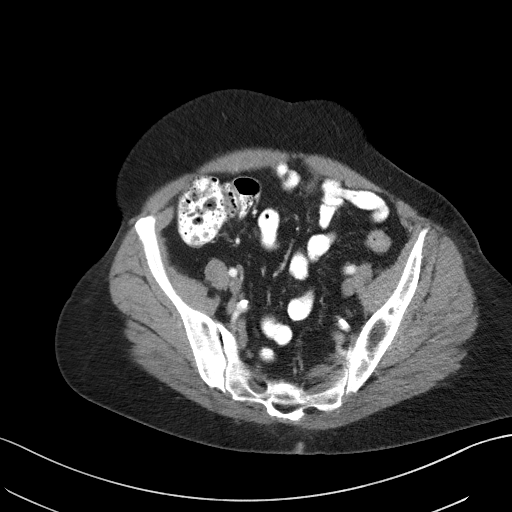
[im 33/79  soft-tissue]
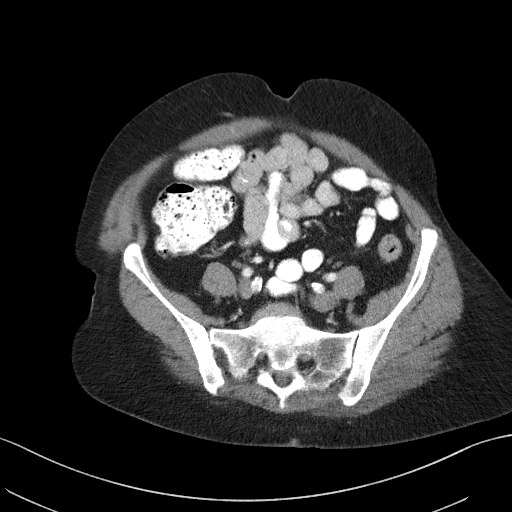
[im 42/79  soft-tissue]
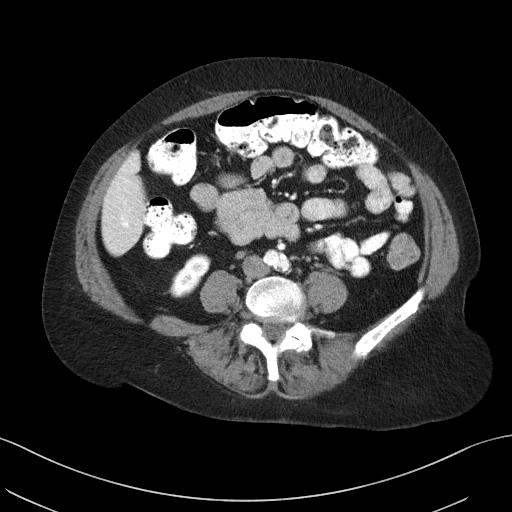
[im 46/79  soft-tissue]
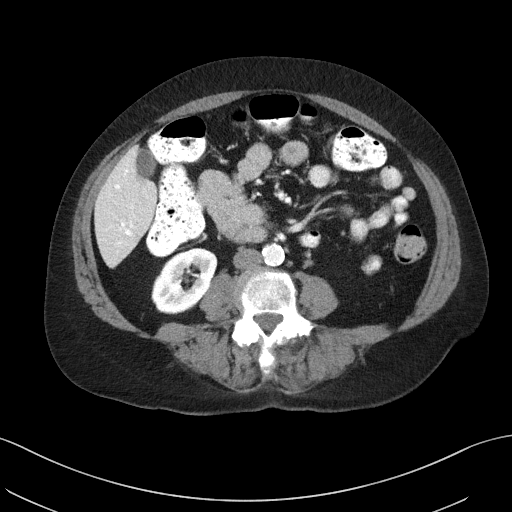
[im 51/79  soft-tissue]
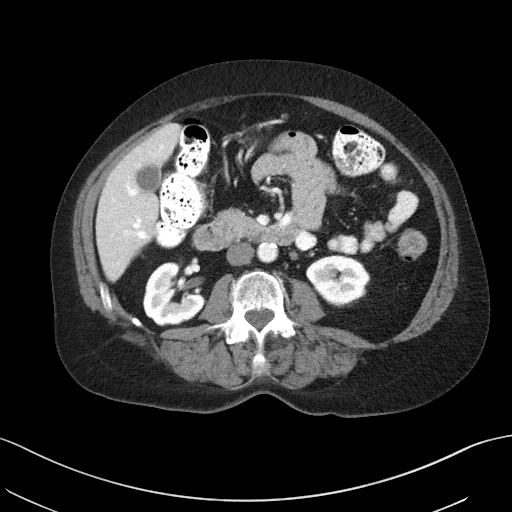
[im 51/79  bone]
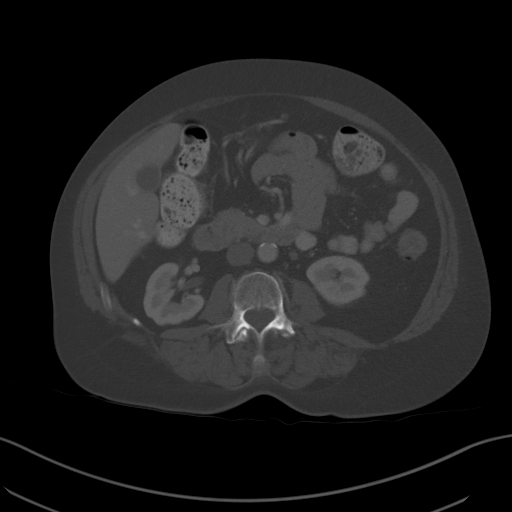
[im 56/79  soft-tissue]
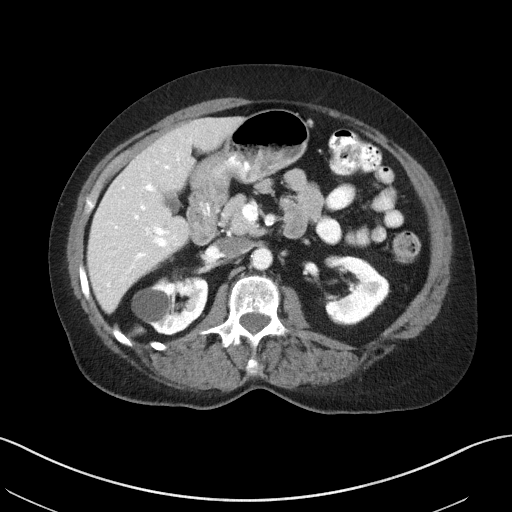
[im 60/79  soft-tissue]
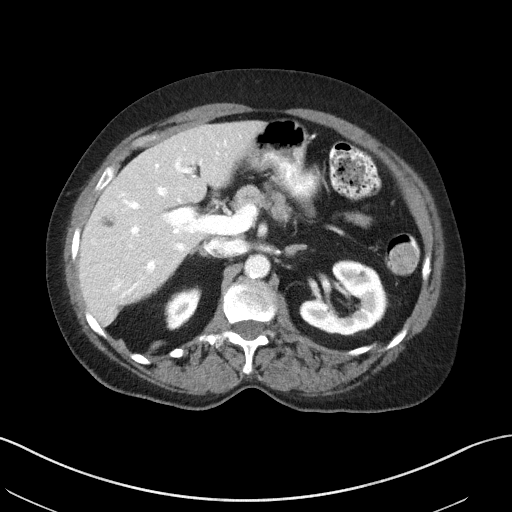
[im 69/79  soft-tissue]
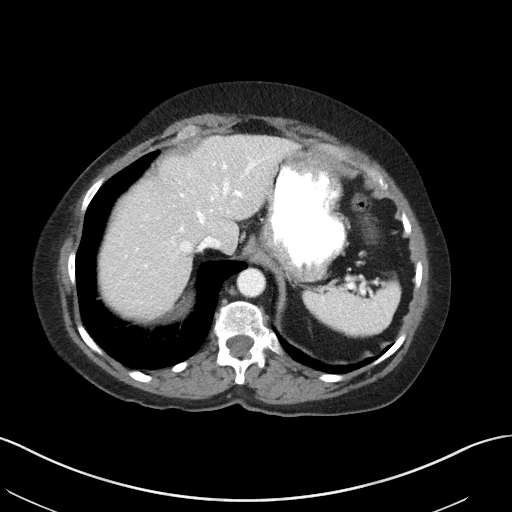
[im 74/79  soft-tissue]
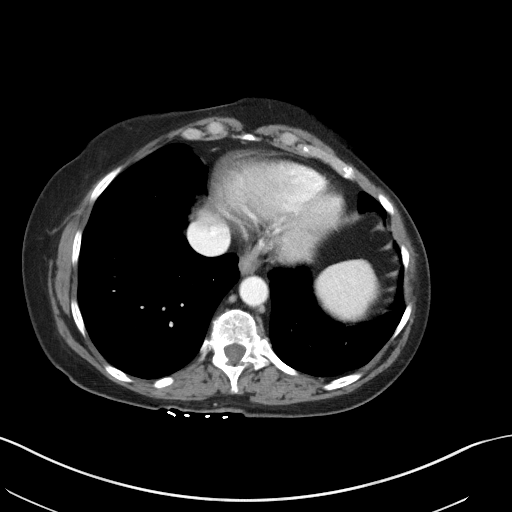

[Series 5: coronal st · coronal · 0.79mm/px · 3 of 102 slices shown]
[im 34/102  soft-tissue]
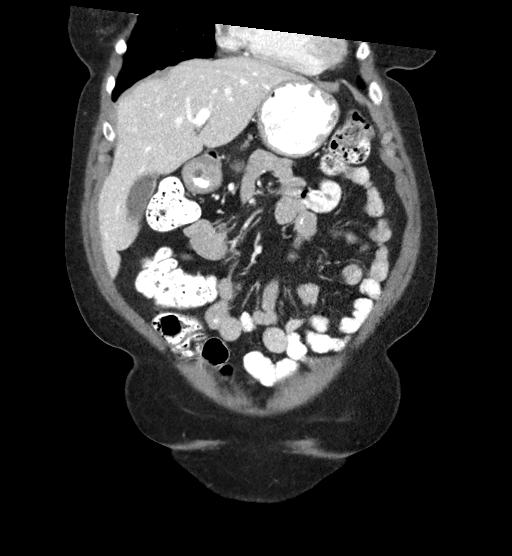
[im 45/102  soft-tissue]
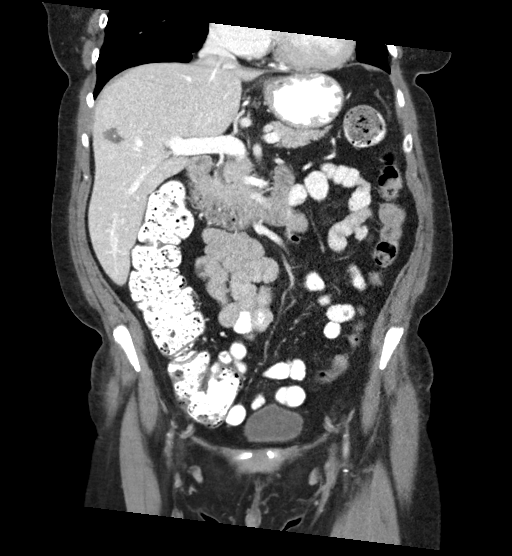
[im 57/102  soft-tissue]
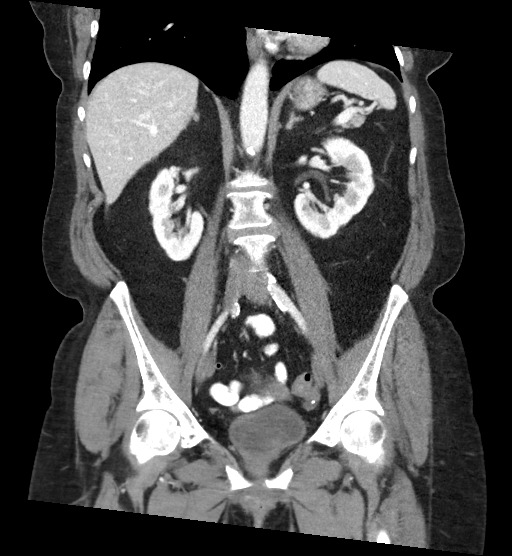

[16 of 46 positions shown; findings below may reference images not displayed]

FINDINGS: Lower chest: No acute abnormality.

Hepatobiliary: In the RIGHT liver, there is a hypodense mass with
central coarse calcification which measures 13 mm (series 2, image
21). Gallbladder is unremarkable. Portal vein is patent.

Pancreas: Unremarkable. No pancreatic ductal dilatation or
surrounding inflammatory changes.

Spleen: Normal in size without focal abnormality.

Adrenals/Urinary Tract: Adrenal glands are unremarkable. No
hydronephrosis. Kidneys enhance symmetrically. 28 mm RIGHT renal
cyst. No obstructive nephrolithiasis. Subcentimeter hypodense
lesions are too small to accurately characterize. Bladder is
decompressed.

Stomach/Bowel: Tiny hiatal hernia. No evidence of bowel obstruction.
Appendix is normal. Mild sigmoid diverticulosis.

Vascular/Lymphatic: Atherosclerotic calcifications of the aorta. No
suspicious lymphadenopathy.

Reproductive: Uterus is present.  No suspicious adnexal masses.

Other: No free air or free fluid.

Musculoskeletal: Degenerative changes of the lumbar spine with
predominately facet arthropathy. Degenerative changes of bilateral
hips with subcortical cyst formation.
IMPRESSION: 1. No CT etiology for abdominal pain identified.
2. There is incidental note of a 13 mm indeterminate hypodense mass
with a central coarse calcification in the liver. This is
nonspecific. Recommend further dedicated evaluation with liver MRI
with and without contrast.

These results will be called to the ordering clinician or
representative by the Radiologist Assistant, and communication
documented in the PACS or [REDACTED].

## 2023-07-27 NOTE — Telephone Encounter (Signed)
 Needs office visit for further refills. I refilled X 1.

## 2023-10-12 ENCOUNTER — Other Ambulatory Visit: Payer: Self-pay | Admitting: Gastroenterology

## 2023-11-02 ENCOUNTER — Other Ambulatory Visit (HOSPITAL_COMMUNITY): Payer: Self-pay | Admitting: Family Medicine

## 2023-11-02 DIAGNOSIS — Z1231 Encounter for screening mammogram for malignant neoplasm of breast: Secondary | ICD-10-CM

## 2023-11-18 ENCOUNTER — Encounter (HOSPITAL_COMMUNITY): Payer: Self-pay

## 2023-11-18 ENCOUNTER — Ambulatory Visit (HOSPITAL_COMMUNITY)
Admission: RE | Admit: 2023-11-18 | Discharge: 2023-11-18 | Disposition: A | Discharge status: Home / Self Care | Source: Ambulatory Visit | Attending: Family Medicine | Admitting: Family Medicine

## 2023-11-18 DIAGNOSIS — Z1231 Encounter for screening mammogram for malignant neoplasm of breast: Secondary | ICD-10-CM | POA: Diagnosis present

## 2023-12-03 ENCOUNTER — Other Ambulatory Visit: Payer: Self-pay

## 2023-12-03 ENCOUNTER — Ambulatory Visit

## 2023-12-03 DIAGNOSIS — R279 Unspecified lack of coordination: Secondary | ICD-10-CM | POA: Insufficient documentation

## 2023-12-03 DIAGNOSIS — M62838 Other muscle spasm: Secondary | ICD-10-CM | POA: Insufficient documentation

## 2023-12-03 DIAGNOSIS — R102 Pelvic and perineal pain unspecified side: Secondary | ICD-10-CM | POA: Diagnosis present

## 2023-12-03 DIAGNOSIS — M6281 Muscle weakness (generalized): Secondary | ICD-10-CM | POA: Insufficient documentation

## 2023-12-03 DIAGNOSIS — M5459 Other low back pain: Secondary | ICD-10-CM | POA: Diagnosis present

## 2023-12-03 NOTE — Therapy (Signed)
 OUTPATIENT PHYSICAL THERAPY FEMALE PELVIC EVALUATION   Patient Name: Christina Erickson MRN: [id number] DOB:1950/11/16, 73 y.o., female Today's Date: 12/03/2023  END OF SESSION:  PT End of Session - 12/03/23 0940     Visit Number 1    Date for Recertification  02/25/24    Authorization Type [employer]    Authorization Time Period auth    Progress Note Due on Visit 10    PT Start Time 0930    PT Stop Time 1010    PT Time Calculation (min) 40 min    Activity Tolerance Patient tolerated treatment well    Behavior During Therapy [facility] for tasks assessed/performed          Past Medical History:  Diagnosis Date   Arthritis    Back pain 06/25/2017   High blood pressure    High cholesterol    Pre-diabetes    Past Surgical History:  Procedure Laterality Date   BREAST CYST ASPIRATION Right 06/2021   BREAST EXCISIONAL BIOPSY Left 08/20/2021   Focal atypical lobular hyperplasia   BREAST LUMPECTOMY WITH RADIOACTIVE SEED LOCALIZATION Left 08/20/2021   Procedure: LEFT BREAST RADIOACTIVE SEED LOCALIZED LUMPECTOMY;  Surgeon: Belinda Cough, MD;  Location: [facility] OR;  Service: General;  Laterality: Left;  LMA   CESAREAN SECTION     COLONOSCOPY WITH PROPOFOL  N/A 03/22/2021   sigmoid diverticulosis and normal rectum and anal verge.   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   Buttock pain 01/08/2022   Allergic rhinitis due to pollen 07/26/2021   Benign essential hypertension 07/26/2021   High cholesterol 07/26/2021   Peripheral neuropathy 07/26/2021   Type 2 diabetes mellitus (HCC) 07/26/2021   Change in bowel movement 12/06/2020    Abdominal pain 12/06/2020   Patellofemoral arthritis of left knee 03/09/2012    PCP: Gerome Tillman CROME, FNP  REFERRING PROVIDER: Ernesto Grayson, MD   REFERRING DIAG: K62.89 (ICD-10-CM) - Other specified diseases of anus and rectum  THERAPY DIAG:  Pelvic pain  Other muscle spasm  Unspecified lack of coordination  Muscle weakness (generalized)  Other low back pain  Rationale for Evaluation and Treatment: Rehabilitation  ONSET DATE: 12/02/2021  SUBJECTIVE:  SUBJECTIVE STATEMENT: Pt states that she is having issues with her hemorrhoids. When she sits on toilet it hurts from her rectum all the way up her back. Sitting and driving is very challenging.    PAIN:  Are you having pain? Yes NPRS scale: 10/10 Pain location: rectal and low back pain  Pain type: aching Pain description: constant   Aggravating factors: sitting, driving, sleeping Relieving factors: when she has a bowel movement, standing  PRECAUTIONS: None  RED FLAGS: None   WEIGHT BEARING RESTRICTIONS: No  FALLS:  Has patient fallen in last 6 months? No  OCCUPATION: not working  ACTIVITY LEVEL : occasional exercises that she had been given in PT  PLOF: Independent  PATIENT GOALS: decrease pain at rectum  PERTINENT HISTORY:  C-section, tubal ligation, peripheral neuropathy, proctalgia, hemorrhoids Sexual abuse: No  BOWEL MOVEMENT: Pain with bowel movement: No Type of bowel movement:Type (Bristol Stool Scale) 4, Frequency 1x/day, and Strain no Fully empty rectum: Yes:   Leakage: No Pads: No Fiber supplement/laxative stool softener  URINATION: Pain with urination: No Fully empty bladder: No Stream: start and stop Urgency: Yes  Frequency: every hour at night; every 2 hours during the day Fluid Intake: 4 glasses a  day Leakage: Coughing Pads: No  INTERCOURSE:  NA  PREGNANCY: Vaginal deliveries 2 Tearing No Episiotomy No C-section deliveries 1 Currently pregnant No  PROLAPSE: Pressure and Bulge - at anus   OBJECTIVE:  Note: Objective measures were completed at Evaluation unless otherwise noted.  12/03/23 PATIENT SURVEYS:   PFIQ-7: 90 (pelvic)  COGNITION: Overall cognitive status: Within functional limits for tasks assessed     SENSATION: Light touch: Appears intact   FUNCTIONAL TESTS:  Squat: decreased LE stability  Single leg stance:  Rt: pelvic drop  Lt: pelvic drop Curl-up test: abdominal distortion    GAIT: Assistive device utilized: None Comments: WNL  POSTURE: rounded shoulders, forward head, decreased lumbar lordosis, increased thoracic kyphosis, and posterior pelvic tilt   LUMBARAROM/PROM:  A/PROM A/PROM  Eval (% available)  Flexion 80  Extension 10  Right lateral flexion 50  Left lateral flexion 50  Right rotation 25  Left rotation 25   (Blank rows = not tested)  PALPATION:   General: tightness in bil lumbar paraspinals  Pelvic Alignment: Rt posterior rotation; posterior pelvic tilt  Abdominal: apical breathing pattern                External Perineal Exam: mild external hemorrhoids non irritated                              Internal Pelvic Floor: dryness vaginally, no tenderness; significant tone in external anal sphincter and pelvic floor muscles rectally  Patient confirms identification and approves PT to assess internal pelvic floor and treatment Yes  PELVIC MMT:   MMT eval  Vaginal 1/5, 13 second hold, 4 repeat contractions  Internal Anal Sphincter 1/5  External Anal Sphincter 1/5  Puborectalis 1/5  Diastasis Recti 3 finger with distortion  (Blank rows = not tested)        TONE: Low vaginally, high external anal sphincter and rectally   PROLAPSE: Grade 2 anterior vaginal wall laxity  TODAY'S TREATMENT:  DATE:  12/03/23 EVAL  Therapeutic activities: Sitz bath Tucks pads Wet wipes and peri bottle to clean after bowel movement Squatty potty/relaxed toilet mechanics     PATIENT EDUCATION:  Education details: See above Person educated: Patient Education method: Programmer, Multimedia, Facilities Manager, Actor cues, Verbal cues, and Handouts Education comprehension: verbalized understanding  HOME EXERCISE PROGRAM: Written handout  ASSESSMENT:  CLINICAL IMPRESSION: Patient is a 73 y.o. female who was seen today for physical therapy evaluation and treatment for rectal pain and hemorrhoids that prevent her from sitting, driving, or sleeping without difficulty and pain. Exam findings notable for abnormal posture, decreased lumbar A/ROM, pelvic drop in single leg stance, tightness in bil lumbar paraspinals/glutes, apical breathing pattern, pelvic floor muscle weakness, low vaginal tone, very high external anal sphincter and posterior pelvic floor muscle tone that when palpated reproduced her symptoms; she had no significant hemorrhoids internally or externally that would explain symptoms at this time. Signs and symptoms are most consistent with external anal sphincter sphincter and posterior pelvic floor muscle spasm. Initial treatment included relaxed toilet mechanics, squatty potty, and gentle cleansing techniques; we discussed findings today and that future treatment will include various manual techniques to low back and external anal sphincter/pelvic floor muscles to help decrease restriction and high tone that is causing pain. She will continue to benefit from skilled PT intervention in order to decrease pain, improve activity tolerance, and address deficits.   OBJECTIVE IMPAIRMENTS: decreased activity tolerance, decreased coordination, decreased endurance, decreased mobility, decreased ROM, decreased  strength, increased fascial restrictions, increased muscle spasms, impaired flexibility, impaired tone, improper body mechanics, postural dysfunction, and pain.   ACTIVITY LIMITATIONS: carrying, lifting, bending, sitting, standing, squatting, transfers, bed mobility, continence, and locomotion level  PARTICIPATION LIMITATIONS: cleaning, medication management, driving, community activity, and exercise  PERSONAL FACTORS: 3+ comorbidities: medical histo are also affecting patient's functional outcome.   REHAB POTENTIAL: Good  CLINICAL DECISION MAKING: Evolving/moderate complexity  EVALUATION COMPLEXITY: Moderate   GOALS: Goals reviewed with patient? Yes  SHORT TERM GOALS: Target date: 12/31/2023   Pt will be independent with HEP in order to improve activity tolerance.   Baseline: Goal status: INITIAL  2.  Patient will report 25% improve in pelvic pain in order to increase activity tolerance.   Baseline: 10/10 Goal status: INITIAL  3.  Pt will be independent with use of squatty potty, relaxed toileting mechanics, and improved bowel movement techniques in order to increase ease of bowel movements and complete evacuation.    Baseline: patient not having bowel movement with optimal body mechanics Goal status: INITIAL  4.  Pt will be independent with gentle perineal cleansing techniques in order to decrease irritation and pain.  Baseline: using toilet paper, no moisturization  Goal status: INITIAL  5.  Pt will be able to teach back and utilize urge suppression technique in order to help reduce number of trips to the bathroom.    Baseline: hourly at night, urgency and every 2 hours during the day Goal status: INITIAL   LONG TERM GOALS: Target date: 02/25/2024  Pt will be independent with advanced HEP in order to improve activity tolerance.   Baseline:  Goal status: INITIAL  2.  Patient will report 75% improve in pelvic pain in order to increase activity tolerance.    Baseline: 10/10 Goal status: INITIAL  3.  Pt will be able to sit for >30 minutes with pain no greater than 2/10 in order to eat meals.  Baseline:  Goal status: INITIAL  4.  Pt will  be able to drive for 30 minutes or longer with pain no greater than 2/10 in order to make it to appointments without difficulty.  Baseline: drives with notable pain Goal status: INITIAL  5.  Pt will be able to sleep without waking due to the pain more than 1x/night in order to achieve better rest.  Baseline: wakes every hour Goal status: INITIAL  6.  Pt will decrease frequency of nightly trips to the bathroom to 1 or less in order to get restful sleep.   Baseline:  Goal status: INITIAL  PLAN:  PT FREQUENCY: 1-2x/week  PT DURATION: 12 visits   PLANNED INTERVENTIONS: 97164- PT Re-evaluation, 97110-Therapeutic exercises, 97530- Therapeutic activity, 97112- Neuromuscular re-education, 97535- Self Care, 02859- Manual therapy, [phone]- Gait training, [phone]- Aquatic Therapy, [phone]- Electrical stimulation (unattended), [phone]- Traction (mechanical), [id number]- Ionotophoresis 4mg /ml Dexamethasone , 79439 (1-2 muscles), 20561 (3+ muscles)- Dry Needling, Patient/Family education, Balance training, Taping, Joint mobilization, Joint manipulation, Spinal manipulation, Spinal mobilization, Scar mobilization, Vestibular training, Cryotherapy, Moist heat, and Biofeedback  PLAN FOR NEXT SESSION: manual techniques to low back or external anal sphincter/pelvic floor muscles; mobility activities, down training  Josette Mares, PT, DPT10/30/2510:23 AM [facility] [address], [address] [place], [place] [address] Phone # [phone] Fax [phone]

## 2023-12-03 NOTE — Patient Instructions (Signed)
 Sitz bath Tucks pads - keep in the fridge, wedge them up by anus when she feel uncomfortable to help soothe Use wet wipes or water  wipes to clean Get a peri bottle to help clean (check the baby/postpartum section of [employer] or [employer]) Get coconut oil and and massage onto hemorrhoids/anus    Squatty potty: When your knees are level or below the level of your hips, pelvic floor muscles are pressed against rectum, preventing ease of bowel movement. By getting knees above the level of the hips, these pelvic floor muscles relax, allowing easier passage of bowel movement. Ways to get knees above hips: o Squatty Potty (7inch and 9inch versions) o Small stool o Roll of toilet paper under each foot o Hardback book or stack of magazines under each foot  Relaxed Toileting mechanics: Once in this position, make sure to lean forward with forearms on thighs, wide knees, relaxed stomach, and breathe.    [facility] [address] [place], [place] [address] Phone # [phone] Fax [phone]

## 2023-12-09 ENCOUNTER — Encounter (INDEPENDENT_AMBULATORY_CARE_PROVIDER_SITE_OTHER): Payer: Self-pay | Admitting: Gastroenterology

## 2024-01-24 IMAGING — MG MM BREAST LOCALIZATION CLIP
4 series · 4 of 12 positions shown · non-contrast
Comparison: Previous exam(s).

CLINICAL DATA: Evaluate post biopsy marker clip placement following
stereotactic core biopsy of a left breast mass.

EXAM:
3D DIAGNOSTIC LEFT MAMMOGRAM POST STEREOTACTIC BIOPSY

[L CC synth-2D]
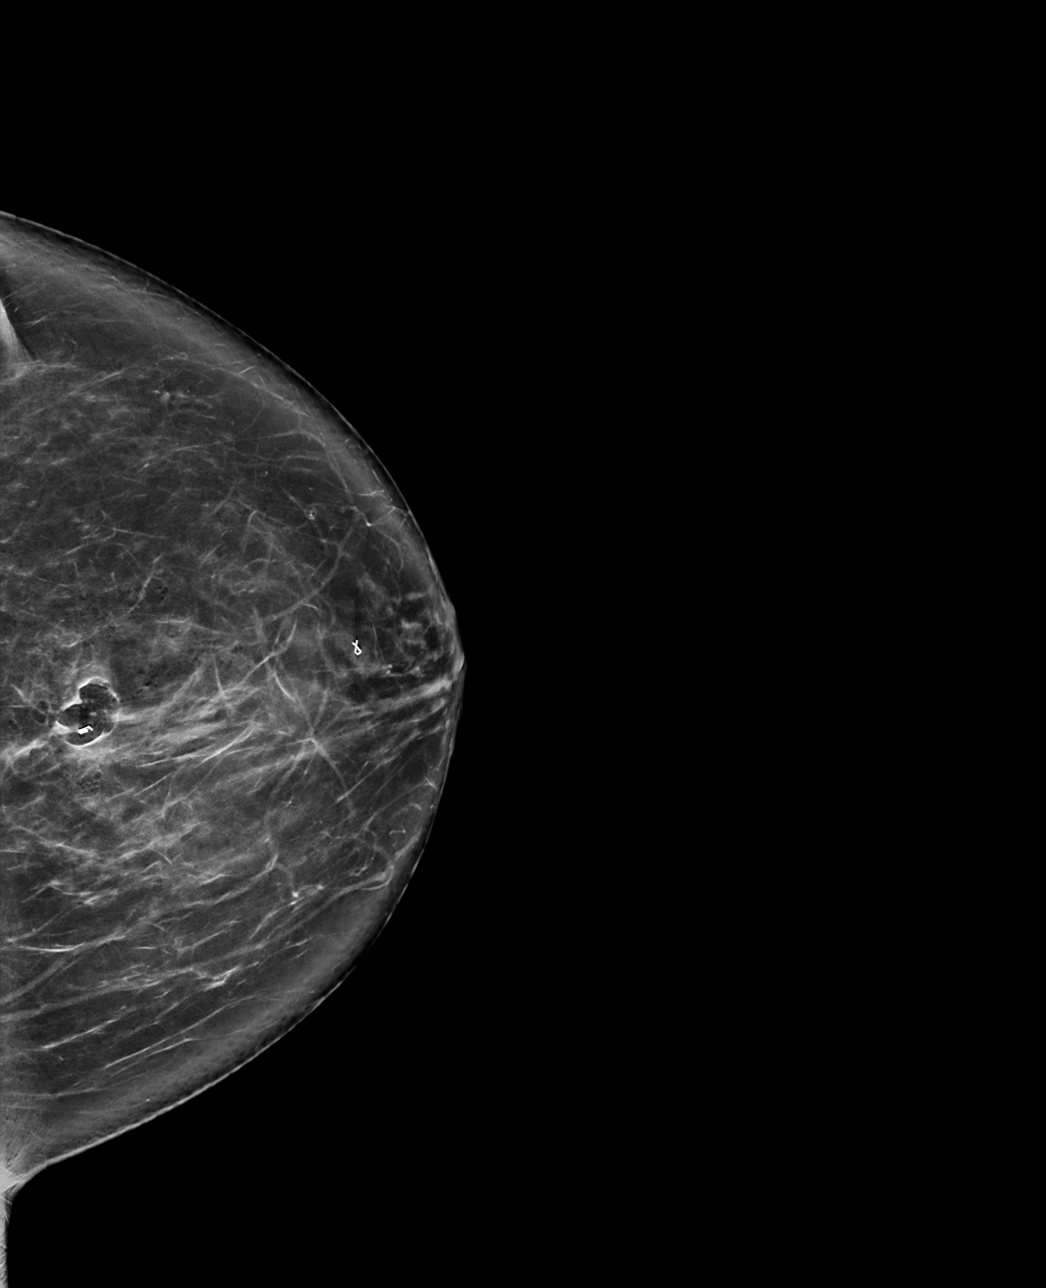

[L ML synth-2D]
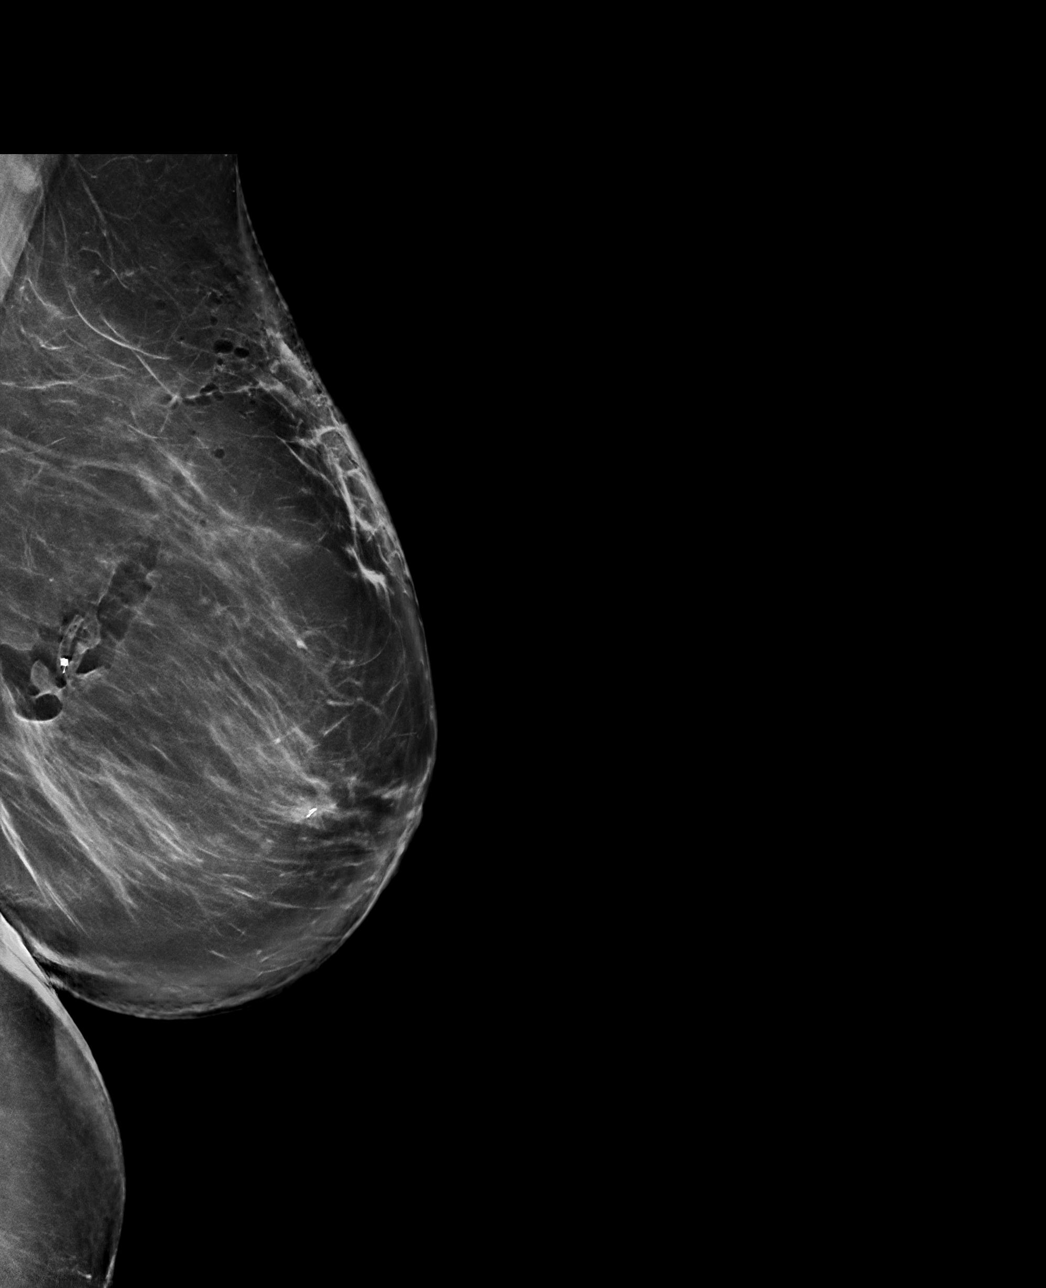

[L ML tomo · tomo slice 52/103.0]
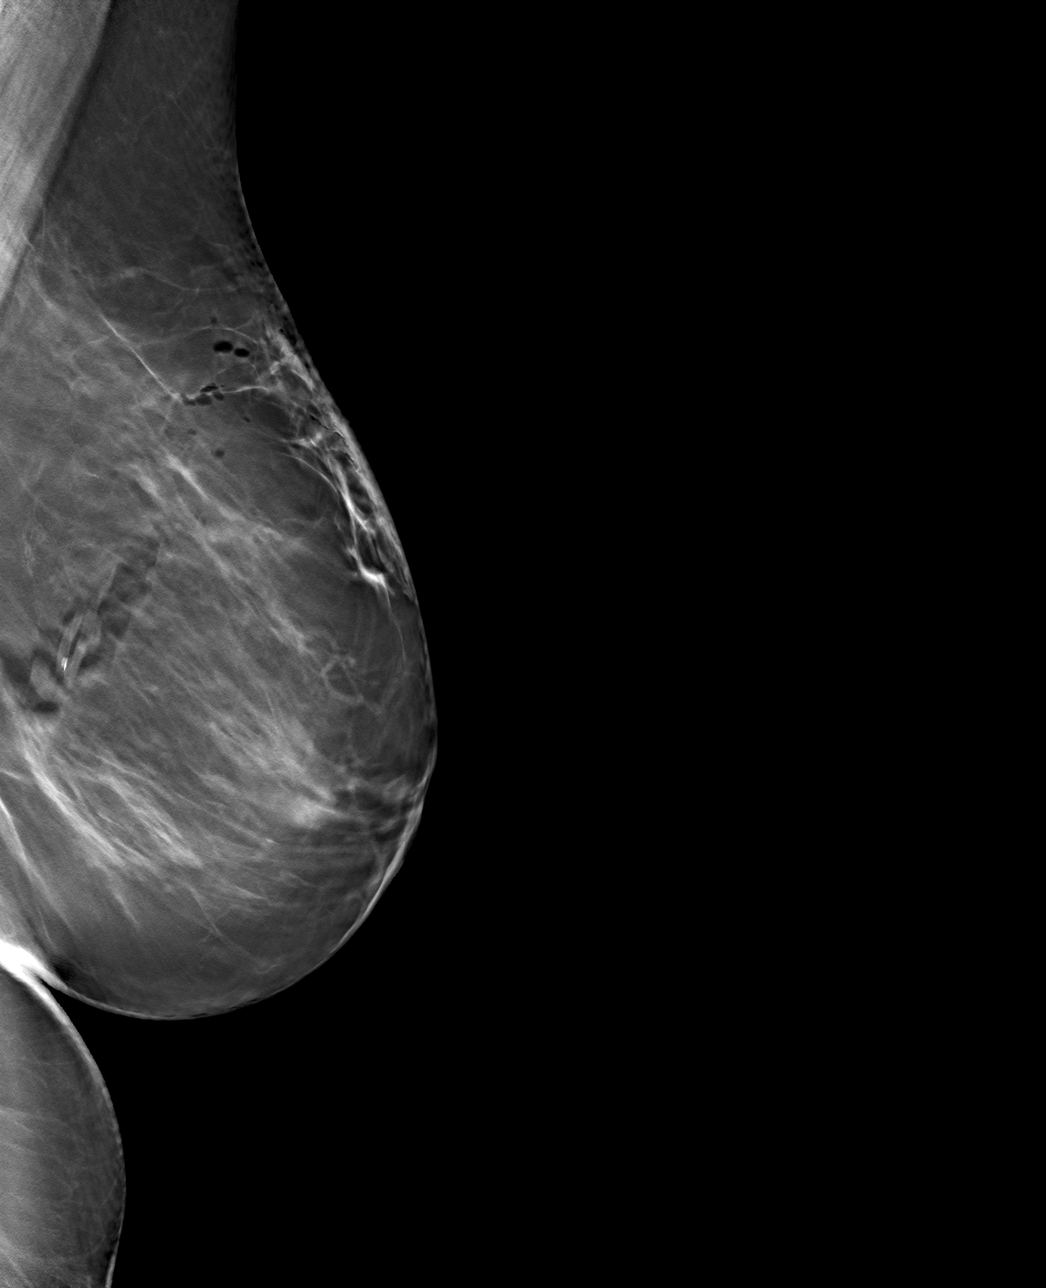

[L CC tomo · tomo slice 43/85.0]
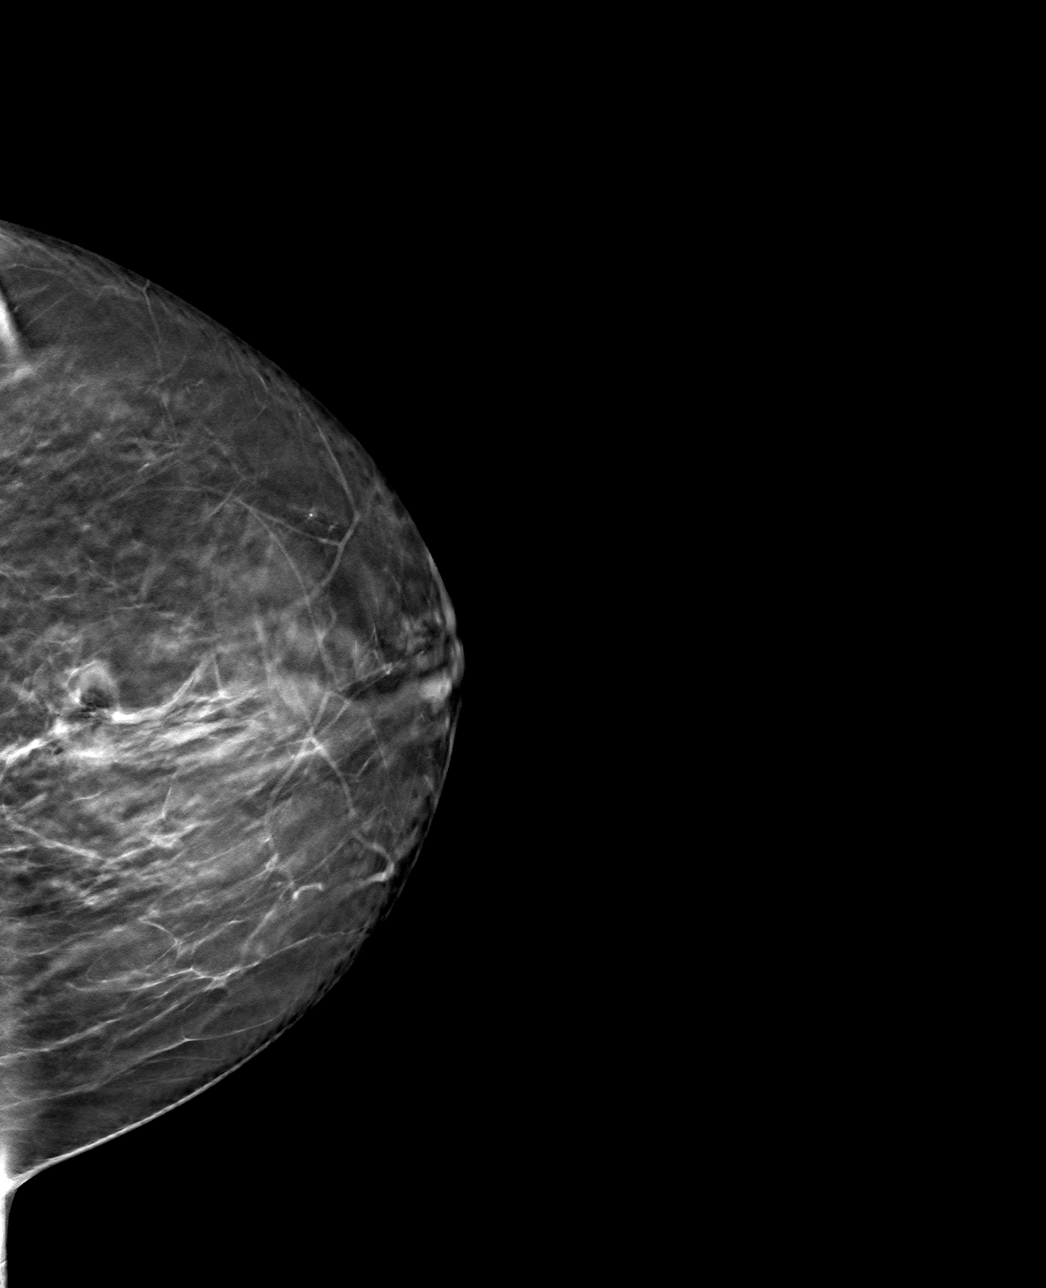

[4 of 12 positions shown; findings below may reference images not displayed]

FINDINGS: 3D Mammographic images were obtained following stereotactic guided
biopsy of a small left breast mass. The biopsy marking clip is in
expected position at the site of biopsy.
IMPRESSION: Appropriate positioning of the coil shaped biopsy marking clip at
the site of biopsy in the posterior, central to slightly lower inner
quadrant, in the expected location of the small breast mass. No
defined residual mass is visualized.

Final Assessment: Post Procedure Mammograms for Marker Placement

## 2024-01-26 ENCOUNTER — Encounter: Payer: Self-pay | Admitting: Physical Therapy

## 2024-01-26 ENCOUNTER — Ambulatory Visit

## 2024-01-26 DIAGNOSIS — R279 Unspecified lack of coordination: Secondary | ICD-10-CM | POA: Insufficient documentation

## 2024-01-26 DIAGNOSIS — R102 Pelvic and perineal pain unspecified side: Secondary | ICD-10-CM | POA: Diagnosis present

## 2024-01-26 DIAGNOSIS — M5459 Other low back pain: Secondary | ICD-10-CM | POA: Insufficient documentation

## 2024-01-26 DIAGNOSIS — M62838 Other muscle spasm: Secondary | ICD-10-CM | POA: Diagnosis present

## 2024-01-26 DIAGNOSIS — M6281 Muscle weakness (generalized): Secondary | ICD-10-CM | POA: Insufficient documentation

## 2024-01-26 NOTE — Therapy (Signed)
 " OUTPATIENT PHYSICAL THERAPY FEMALE PELVIC TREATMENT   Patient Name: Christina Erickson MRN: [id number] DOB:[birth year], 73 y.o., female Today's Date: 01/26/2024  END OF SESSION:  PT End of Session - 01/26/24 1030     Visit Number 2    Date for Recertification  02/25/24    Authorization Type [employer] Medicare    Authorization Time Period [employer] Dual MCR  No Auth Required    Progress Note Due on Visit 10    PT Start Time 1025    PT Stop Time 1100    PT Time Calculation (min) 35 min    Activity Tolerance Patient tolerated treatment well    Behavior During Therapy [facility] for tasks assessed/performed           Past Medical History:  Diagnosis Date   Arthritis    Back pain 06/25/2017   High blood pressure    High cholesterol    Pre-diabetes    Past Surgical History:  Procedure Laterality Date   BREAST CYST ASPIRATION Right 06/2021   BREAST EXCISIONAL BIOPSY Left 08/20/2021   Focal atypical lobular hyperplasia   BREAST LUMPECTOMY WITH RADIOACTIVE SEED LOCALIZATION Left 08/20/2021   Procedure: LEFT BREAST RADIOACTIVE SEED LOCALIZED LUMPECTOMY;  Surgeon: Belinda Cough, MD;  Location: [facility] OR;  Service: General;  Laterality: Left;  LMA   CESAREAN SECTION     COLONOSCOPY WITH PROPOFOL  N/A 03/22/2021   sigmoid diverticulosis and normal rectum and anal verge.   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   Buttock pain 01/08/2022   Allergic rhinitis due to pollen 07/26/2021   Benign essential hypertension 07/26/2021   High cholesterol 07/26/2021   Peripheral neuropathy 07/26/2021   Type 2 diabetes mellitus (HCC) 07/26/2021   Change in bowel  movement 12/06/2020   Abdominal pain 12/06/2020   Patellofemoral arthritis of left knee 03/09/2012    PCP: Gerome Tillman CROME, FNP  REFERRING PROVIDER: Ernesto Grayson, MD   REFERRING DIAG: K62.89 (ICD-10-CM) - Other specified diseases of anus and rectum  THERAPY DIAG:  Pelvic pain  Other muscle spasm  Unspecified lack of coordination  Muscle weakness (generalized)  Other low back pain  Rationale for Evaluation and Treatment: Rehabilitation  ONSET DATE: 12/02/2021  SUBJECTIVE:  SUBJECTIVE STATEMENT: Patient reports that she feels the same. She tried the exercises. Feet on the stool are very uncomfortable but she is using it. Does not work well for BM. She eats some fruit before going to bed.  Taking meds for her back- hydrocodone . They work. Pain can run all the way to her back and neck and her eye. Back PT helped a little but did not help her pelvic floor.  Patient reports that she has discomfort at night has lean over to reduce pain  Pelvic pressure feels like something is coming out, she has to stand up to relieve pressure Still does some back exercises , they help some States that there is nerve in the back  that has pressure on it, she might have to have surgery    Last visit Pt states that she is having issues with her hemorrhoids. When she sits on toilet it hurts from her rectum all the way up her back. Sitting and driving is very challenging.    PAIN:  Are you having pain? Yes NPRS scale: 10/10 Pain location: rectal and low back pain  Pain type: aching Pain description: constant   Aggravating factors: sitting, driving, sleeping Relieving factors: when she has a bowel movement, standing  PRECAUTIONS: None  RED FLAGS: None   WEIGHT BEARING RESTRICTIONS: No  FALLS:  Has  patient fallen in last 6 months? No  OCCUPATION: not working  ACTIVITY LEVEL : occasional exercises that she had been given in PT  PLOF: Independent  PATIENT GOALS: decrease pain at rectum  PERTINENT HISTORY:  C-section, tubal ligation, peripheral neuropathy, proctalgia, hemorrhoids Sexual abuse: No  BOWEL MOVEMENT: Pain with bowel movement: No Type of bowel movement:Type (Bristol Stool Scale) 4, Frequency 1x/day, and Strain no Fully empty rectum: Yes:   Leakage: No Pads: No Fiber supplement/laxative stool softener  URINATION: Pain with urination: No Fully empty bladder: No Stream: start and stop Urgency: Yes  Frequency: every hour at night; every 2 hours during the day Fluid Intake: 4 glasses a day Leakage: Coughing Pads: No  INTERCOURSE:  NA  PREGNANCY: Vaginal deliveries 2 Tearing No Episiotomy No C-section deliveries 1 Currently pregnant No  PROLAPSE: Pressure and Bulge - at anus   OBJECTIVE:  Note: Objective measures were completed at Evaluation unless otherwise noted.  12/03/23 PATIENT SURVEYS:   PFIQ-7: 90 (pelvic)  COGNITION: Overall cognitive status: Within functional limits for tasks assessed     SENSATION: Light touch: Appears intact   FUNCTIONAL TESTS:  Squat: decreased LE stability  Single leg stance:  Rt: pelvic drop  Lt: pelvic drop Curl-up test: abdominal distortion    GAIT: Assistive device utilized: None Comments: WNL  POSTURE: rounded shoulders, forward head, decreased lumbar lordosis, increased thoracic kyphosis, and posterior pelvic tilt   LUMBARAROM/PROM:  A/PROM A/PROM  Eval (% available)  Flexion 80  Extension 10  Right lateral flexion 50  Left lateral flexion 50  Right rotation 25  Left rotation 25   (Blank rows = not tested)  PALPATION:   General: tightness in bil lumbar paraspinals  Pelvic Alignment: Rt posterior rotation; posterior pelvic tilt  Abdominal: apical breathing pattern                 External Perineal Exam: mild external hemorrhoids non irritated                              Internal Pelvic Floor: dryness vaginally, no tenderness;  significant tone in external anal sphincter and pelvic floor muscles rectally  Patient confirms identification and approves PT to assess internal pelvic floor and treatment Yes  PELVIC MMT:   MMT eval  Vaginal 1/5, 13 second hold, 4 repeat contractions  Internal Anal Sphincter 1/5  External Anal Sphincter 1/5  Puborectalis 1/5  Diastasis Recti 3 finger with distortion  (Blank rows = not tested)        TONE: Low vaginally, high external anal sphincter and rectally   PROLAPSE: Grade 2 anterior vaginal wall laxity  TODAY'S TREATMENT:                                                                                                                              DATE:  01/26/24 Patient confirms identification and approves physical therapist to perform internal soft tissue work   Manual- tailbone mobilizations/ rectal work in left sidelying Diaphragmatic breathing  Review of HEP, progress   12/03/23 EVAL  Therapeutic activities: Sitz bath Tucks pads Wet wipes and peri bottle to clean after bowel movement Squatty potty/relaxed toilet mechanics     PATIENT EDUCATION:  Education details: See above Person educated: Patient Education method: Programmer, Multimedia, Demonstration, Actor cues, Verbal cues, and Handouts Education comprehension: verbalized understanding  HOME EXERCISE PROGRAM: Written handout  ASSESSMENT:  CLINICAL IMPRESSION: Patient returning to PT after about 7 weeks. She seemed fatigued, reported that she has done some exercises, but rectal pain has been the same. Patient was seen today for treatment of rectal pain and hemorrhoids. Patient with hight tone rectally and puborectalis. Patient did well with exercises, manual therapy and education today and reported relief after manual therapy. We discussed progress, HEP and  recommended more consistency as patient can. Treatment session focused on manual therapy to improve rectal pain. Patient has scoliosis and significant back pain that is limiting her, affecting her sleep and mobility. Pain on her left lumbar spin is 6/10 post treatment, patient reported that rectal pain is a little better post manual therapy. Patient is progressing slowly towards goals and will benefit from continued PT to address deficits, reduce rectal pain and improve quality of life.         eval Patient is a 73 y.o. female who was seen today for physical therapy evaluation and treatment for rectal pain and hemorrhoids that prevent her from sitting, driving, or sleeping without difficulty and pain. Exam findings notable for abnormal posture, decreased lumbar A/ROM, pelvic drop in single leg stance, tightness in bil lumbar paraspinals/glutes, apical breathing pattern, pelvic floor muscle weakness, low vaginal tone, very high external anal sphincter and posterior pelvic floor muscle tone that when palpated reproduced her symptoms; she had no significant hemorrhoids internally or externally that would explain symptoms at this time. Signs and symptoms are most consistent with external anal sphincter sphincter and posterior pelvic floor muscle spasm. Initial treatment included relaxed toilet mechanics, squatty potty, and gentle cleansing techniques; we discussed findings today and that future  treatment will include various manual techniques to low back and external anal sphincter/pelvic floor muscles to help decrease restriction and high tone that is causing pain. She will continue to benefit from skilled PT intervention in order to decrease pain, improve activity tolerance, and address deficits.   OBJECTIVE IMPAIRMENTS: decreased activity tolerance, decreased coordination, decreased endurance, decreased mobility, decreased ROM, decreased strength, increased fascial restrictions, increased muscle spasms,  impaired flexibility, impaired tone, improper body mechanics, postural dysfunction, and pain.   ACTIVITY LIMITATIONS: carrying, lifting, bending, sitting, standing, squatting, transfers, bed mobility, continence, and locomotion level  PARTICIPATION LIMITATIONS: cleaning, medication management, driving, community activity, and exercise  PERSONAL FACTORS: 3+ comorbidities: medical histo are also affecting patient's functional outcome.   REHAB POTENTIAL: Good  CLINICAL DECISION MAKING: Evolving/moderate complexity  EVALUATION COMPLEXITY: Moderate   GOALS: Goals reviewed with patient? Yes  SHORT TERM GOALS: Target date: 12/31/2023   Pt will be independent with HEP in order to improve activity tolerance.   Baseline: Goal status: INITIAL  2.  Patient will report 25% improve in pelvic pain in order to increase activity tolerance.   Baseline: 10/10 Goal status: INITIAL  3.  Pt will be independent with use of squatty potty, relaxed toileting mechanics, and improved bowel movement techniques in order to increase ease of bowel movements and complete evacuation.    Baseline: patient not having bowel movement with optimal body mechanics Goal status: ongoing 01/26/2024  4.  Pt will be independent with gentle perineal cleansing techniques in order to decrease irritation and pain.  Baseline: using toilet paper, no moisturization  Goal status: INITIAL  5.  Pt will be able to teach back and utilize urge suppression technique in order to help reduce number of trips to the bathroom.    Baseline: hourly at night, urgency and every 2 hours during the day Goal status: INITIAL   LONG TERM GOALS: Target date: 02/25/2024  Pt will be independent with advanced HEP in order to improve activity tolerance.   Baseline:  Goal status: INITIAL  2.  Patient will report 75% improve in pelvic pain in order to increase activity tolerance.   Baseline: 10/10 Goal status: INITIAL  3.  Pt will be able  to sit for >30 minutes with pain no greater than 2/10 in order to eat meals.  Baseline:  Goal status: INITIAL  4.  Pt will be able to drive for 30 minutes or longer with pain no greater than 2/10 in order to make it to appointments without difficulty.  Baseline: drives with notable pain Goal status: INITIAL  5.  Pt will be able to sleep without waking due to the pain more than 1x/night in order to achieve better rest.  Baseline: wakes every hour Goal status: INITIAL  6.  Pt will decrease frequency of nightly trips to the bathroom to 1 or less in order to get restful sleep.   Baseline:  Goal status: INITIAL  PLAN:  PT FREQUENCY: 1-2x/week  PT DURATION: 12 visits   PLANNED INTERVENTIONS: 97164- PT Re-evaluation, 97110-Therapeutic exercises, 97530- Therapeutic activity, 97112- Neuromuscular re-education, 97535- Self Care, [id number]- Manual therapy, [phone]- Gait training, [phone]- Aquatic Therapy, [phone]- Electrical stimulation (unattended), [phone]- Traction (mechanical), [id number]- Ionotophoresis 4mg /ml Dexamethasone , 79439 (1-2 muscles), [id number] (3+ muscles)- Dry Needling, Patient/Family education, Balance training, Taping, Joint mobilization, Joint manipulation, Spinal manipulation, Spinal mobilization, Scar mobilization, Vestibular training, Cryotherapy, Moist heat, and Biofeedback  PLAN FOR NEXT SESSION: manual techniques to low back or external anal sphincter/pelvic floor muscles; mobility activities, down training  Josette Mares, PT, DPT12/23/202510:32 AM [facility] [address] [place], [place] [address] Phone # [phone] Fax [phone]   "

## 2024-02-03 ENCOUNTER — Ambulatory Visit

## 2024-02-03 DIAGNOSIS — M62838 Other muscle spasm: Secondary | ICD-10-CM

## 2024-02-03 DIAGNOSIS — R279 Unspecified lack of coordination: Secondary | ICD-10-CM

## 2024-02-03 DIAGNOSIS — R102 Pelvic and perineal pain unspecified side: Secondary | ICD-10-CM

## 2024-02-03 DIAGNOSIS — M5459 Other low back pain: Secondary | ICD-10-CM

## 2024-02-03 DIAGNOSIS — M6281 Muscle weakness (generalized): Secondary | ICD-10-CM

## 2024-02-03 NOTE — Therapy (Signed)
 " OUTPATIENT PHYSICAL THERAPY FEMALE PELVIC TREATMENT   Patient Name: Christina Erickson MRN: [id number] DOB:04/13/1950, 73 y.o., female Today's Date: 02/03/2024  END OF SESSION:  PT End of Session - 02/03/24 0934     Visit Number 3    Date for Recertification  02/25/24    Authorization Type U[employer] Medicare    Authorization Time Period UHC Dual MCR  No Auth Required    Progress Note Due on Visit 10    PT Start Time 0930    PT Stop Time 1010    PT Time Calculation (min) 40 min    Activity Tolerance Patient tolerated treatment well    Behavior During Therapy [facility] for tasks assessed/performed           Past Medical History:  Diagnosis Date   Arthritis    Back pain 06/25/2017   High blood pressure    High cholesterol    Pre-diabetes    Past Surgical History:  Procedure Laterality Date   BREAST CYST ASPIRATION Right 06/2021   BREAST EXCISIONAL BIOPSY Left 08/20/2021   Focal atypical lobular hyperplasia   BREAST LUMPECTOMY WITH RADIOACTIVE SEED LOCALIZATION Left 08/20/2021   Procedure: LEFT BREAST RADIOACTIVE SEED LOCALIZED LUMPECTOMY;  Surgeon: Belinda Cough, MD;  Location: [facility] OR;  Service: General;  Laterality: Left;  LMA   CESAREAN SECTION     COLONOSCOPY WITH PROPOFOL  N/A 03/22/2021   sigmoid diverticulosis and normal rectum and anal verge.   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   Buttock pain 01/08/2022   Allergic rhinitis due to pollen 07/26/2021   Benign essential hypertension 07/26/2021   High cholesterol 07/26/2021   Peripheral neuropathy 07/26/2021   Type 2 diabetes mellitus (HCC) 07/26/2021   Change in bowel  movement 12/06/2020   Abdominal pain 12/06/2020   Patellofemoral arthritis of left knee 03/09/2012    PCP: Gerome Tillman CROME, FNP  REFERRING PROVIDER: Ernesto Grayson, MD   REFERRING DIAG: K62.89 (ICD-10-CM) - Other specified diseases of anus and rectum  THERAPY DIAG:  Pelvic pain  Other muscle spasm  Unspecified lack of coordination  Muscle weakness (generalized)  Other low back pain  Rationale for Evaluation and Treatment: Rehabilitation  ONSET DATE: 12/02/2021  SUBJECTIVE:  SUBJECTIVE STATEMENT: Pt states that last session may have helped some. Her neck is bothering her right now, but her low back is feeling ok right now.    PAIN:  Are you having pain? Yes NPRS scale: 7/10 Pain location: rectal and low back pain  Pain type: aching Pain description: constant   Aggravating factors: sitting, driving, sleeping Relieving factors: when she has a bowel movement, standing  PRECAUTIONS: None  RED FLAGS: None   WEIGHT BEARING RESTRICTIONS: No  FALLS:  Has patient fallen in last 6 months? No  OCCUPATION: not working  ACTIVITY LEVEL : occasional exercises that she had been given in PT  PLOF: Independent  PATIENT GOALS: decrease pain at rectum  PERTINENT HISTORY:  C-section, tubal ligation, peripheral neuropathy, proctalgia, hemorrhoids Sexual abuse: No  BOWEL MOVEMENT: Pain with bowel movement: No Type of bowel movement:Type (Bristol Stool Scale) 4, Frequency 1x/day, and Strain no Fully empty rectum: Yes:   Leakage: No Pads: No Fiber supplement/laxative stool softener  URINATION: Pain with urination: No Fully empty bladder: No Stream: start and stop Urgency: Yes  Frequency: every hour at night; every 2 hours during the day Fluid Intake: 4 glasses a day Leakage:  Coughing Pads: No  INTERCOURSE:  NA  PREGNANCY: Vaginal deliveries 2 Tearing No Episiotomy No C-section deliveries 1 Currently pregnant No  PROLAPSE: Pressure and Bulge - at anus   OBJECTIVE:  Note: Objective measures were completed at Evaluation unless otherwise noted.  12/03/23 PATIENT SURVEYS:   PFIQ-7: 90 (pelvic)  COGNITION: Overall cognitive status: Within functional limits for tasks assessed     SENSATION: Light touch: Appears intact   FUNCTIONAL TESTS:  Squat: decreased LE stability  Single leg stance:  Rt: pelvic drop  Lt: pelvic drop Curl-up test: abdominal distortion    GAIT: Assistive device utilized: None Comments: WNL  POSTURE: rounded shoulders, forward head, decreased lumbar lordosis, increased thoracic kyphosis, and posterior pelvic tilt   LUMBARAROM/PROM:  A/PROM A/PROM  Eval (% available)  Flexion 80  Extension 10  Right lateral flexion 50  Left lateral flexion 50  Right rotation 25  Left rotation 25   (Blank rows = not tested)  PALPATION:   General: tightness in bil lumbar paraspinals  Pelvic Alignment: Rt posterior rotation; posterior pelvic tilt  Abdominal: apical breathing pattern                External Perineal Exam: mild external hemorrhoids non irritated                              Internal Pelvic Floor: dryness vaginally, no tenderness; significant tone in external anal sphincter and pelvic floor muscles rectally  Patient confirms identification and approves PT to assess internal pelvic floor and treatment Yes  PELVIC MMT:   MMT eval  Vaginal 1/5, 13 second hold, 4 repeat contractions  Internal Anal Sphincter 1/5  External Anal Sphincter 1/5  Puborectalis 1/5  Diastasis Recti 3 finger with distortion  (Blank rows = not tested)        TONE: Low vaginally, high external anal sphincter and rectally   PROLAPSE: Grade 2 anterior vaginal wall laxity  TODAY'S TREATMENT:  DATE:  02/03/24 Manual: Pt provides verbal consent for internal vaginal/rectal pelvic floor exam. Internal rectal external anal sphincter and puborectalis release in Lt sidelying Coccyx mobilization and traction in Lt sidelying Supine vaginal posterior pelvic floor muscle release bil  Neuromuscular re-education: Diaphragmatic breathing and pelvic floor muscle lengthening with inhales using mutlimodal cues    01/26/24 Patient confirms identification and approves physical therapist to perform internal soft tissue work   Manual- tailbone mobilizations/ rectal work in left sidelying Diaphragmatic breathing  Review of HEP, progress   12/03/23 EVAL  Therapeutic activities: Sitz bath Tucks pads Wet wipes and peri bottle to clean after bowel movement Squatty potty/relaxed toilet mechanics     PATIENT EDUCATION:  Education details: See above Person educated: Patient Education method: Programmer, Multimedia, Facilities Manager, Actor cues, Verbal cues, and Handouts Education comprehension: verbalized understanding  HOME EXERCISE PROGRAM: Written handout  ASSESSMENT:  CLINICAL IMPRESSION:  Patient is a 73 y.o. female who was seen today for physical therapy treatment for rectal pain and hemorrhoids that prevent her from sitting, driving, or sleeping without difficulty and pain. Pt had some minor reduction in pain after last session, but reports high levels of rectal pain today. We continued to work on internal pelvic floor muscle release techniques today. We started this work rectally in order to target specific tension and pain in external anal sphincter and puborectalis, but due to high level of tissue irritability and poor tolerance, we switched to vaginal pelvic floor muscle release focusing on posterior fibers. She still had notable pain, but better tolerance. We discussed diaphragmatic breathing and pelvic  floor muscle lengthening with her inhales and how to use to help with pain and release of tension. She needs further cuing on this in future sessions. We looked at the model and pt education performed on the areas that we were working today. She will continue to benefit from skilled PT intervention in order to decrease pain, improve activity tolerance, and address deficits.   OBJECTIVE IMPAIRMENTS: decreased activity tolerance, decreased coordination, decreased endurance, decreased mobility, decreased ROM, decreased strength, increased fascial restrictions, increased muscle spasms, impaired flexibility, impaired tone, improper body mechanics, postural dysfunction, and pain.   ACTIVITY LIMITATIONS: carrying, lifting, bending, sitting, standing, squatting, transfers, bed mobility, continence, and locomotion level  PARTICIPATION LIMITATIONS: cleaning, medication management, driving, community activity, and exercise  PERSONAL FACTORS: 3+ comorbidities: medical histo are also affecting patient's functional outcome.   REHAB POTENTIAL: Good  CLINICAL DECISION MAKING: Evolving/moderate complexity  EVALUATION COMPLEXITY: Moderate   GOALS: Goals reviewed with patient? Yes  SHORT TERM GOALS: Target date: 12/31/2023   Pt will be independent with HEP in order to improve activity tolerance.   Baseline: Goal status: IN PROGRESS 02/03/24  2.  Patient will report 25% improve in pelvic pain in order to increase activity tolerance.   Baseline: 10/10 Goal status: IN PROGRESS 02/03/24  3.  Pt will be independent with use of squatty potty, relaxed toileting mechanics, and improved bowel movement techniques in order to increase ease of bowel movements and complete evacuation.    Baseline: patient not having bowel movement with optimal body mechanics Goal status: ongoing 01/26/2024  4.  Pt will be independent with gentle perineal cleansing techniques in order to decrease irritation and pain.   Baseline: using toilet paper, no moisturization  Goal status: PROGRESS 12/31/25AL  5.  Pt will be able to teach back and utilize urge suppression technique in order to help reduce number of trips to the bathroom.    Baseline: hourly  at night, urgency and every 2 hours during the day Goal status: IN PROGRESS 02/03/24   LONG TERM GOALS: Target date: 02/25/2024  Pt will be independent with advanced HEP in order to improve activity tolerance.   Baseline:  Goal status: IN PROGRESS 02/03/24  2.  Patient will report 75% improve in pelvic pain in order to increase activity tolerance.   Baseline: 10/10 Goal status: IN PROGRESS 02/03/24  3.  Pt will be able to sit for >30 minutes with pain no greater than 2/10 in order to eat meals.  Baseline:  Goal status: IN PROGRESS 02/03/24  4.  Pt will be able to drive for 30 minutes or longer with pain no greater than 2/10 in order to make it to appointments without difficulty.  Baseline: drives with notable pain Goal status: IN PROGRESS 02/03/24  5.  Pt will be able to sleep without waking due to the pain more than 1x/night in order to achieve better rest.  Baseline: wakes every hour Goal status: IN PROGRESS 02/03/24  6.  Pt will decrease frequency of nightly trips to the bathroom to 1 or less in order to get restful sleep.   Baseline:  Goal status: IN PROGRESS 02/03/24  PLAN:  PT FREQUENCY: 1-2x/week  PT DURATION: 12 visits   PLANNED INTERVENTIONS: 97164- PT Re-evaluation, 97110-Therapeutic exercises, 97530- Therapeutic activity, 97112- Neuromuscular re-education, 97535- Self Care, 02859- Manual therapy, [phone]- Gait training, [phone]- Aquatic Therapy, [phone]- Electrical stimulation (unattended), [phone]- Traction (mechanical), [id number]- Ionotophoresis 4mg /ml Dexamethasone , 79439 (1-2 muscles), [phone] (3+ muscles)- Dry Needling, Patient/Family education, Balance training, Taping, Joint mobilization, Joint manipulation, Spinal manipulation, Spinal  mobilization, Scar mobilization, Vestibular training, Cryotherapy, Moist heat, and Biofeedback  PLAN FOR NEXT SESSION: manual techniques to low back or external anal sphincter/pelvic floor muscles; mobility activities, down training  Josette Mares, PT, DPT12/31/202510:09 AM [facility] [address], [address] [place], [place] [address] Phone # [phone] Fax [phone]   "

## 2024-02-09 ENCOUNTER — Ambulatory Visit

## 2024-02-09 DIAGNOSIS — R279 Unspecified lack of coordination: Secondary | ICD-10-CM | POA: Insufficient documentation

## 2024-02-09 DIAGNOSIS — M62838 Other muscle spasm: Secondary | ICD-10-CM | POA: Insufficient documentation

## 2024-02-09 DIAGNOSIS — R102 Pelvic and perineal pain unspecified side: Secondary | ICD-10-CM | POA: Insufficient documentation

## 2024-02-09 DIAGNOSIS — M5459 Other low back pain: Secondary | ICD-10-CM | POA: Insufficient documentation

## 2024-02-09 DIAGNOSIS — M6281 Muscle weakness (generalized): Secondary | ICD-10-CM | POA: Insufficient documentation

## 2024-02-09 NOTE — Therapy (Signed)
 " OUTPATIENT PHYSICAL THERAPY FEMALE PELVIC TREATMENT   Patient Name: Christina Erickson MRN: [id number] DOB:10-20-1950, 74 y.o., female Today's Date: 02/09/2024  END OF SESSION:  PT End of Session - 02/09/24 0923     Visit Number 4    Date for Recertification  02/25/24    Authorization Type [employer]HC Medicare    Authorization Time Period UHC Dual MCR  No Auth Required    Progress Note Due on Visit 10    PT Start Time 0930    PT Stop Time 1010    PT Time Calculation (min) 40 min    Activity Tolerance Patient tolerated treatment well    Behavior During Therapy [facility] for tasks assessed/performed           Past Medical History:  Diagnosis Date   Arthritis    Back pain 06/25/2017   High blood pressure    High cholesterol    Pre-diabetes    Past Surgical History:  Procedure Laterality Date   BREAST CYST ASPIRATION Right 06/2021   BREAST EXCISIONAL BIOPSY Left 08/20/2021   Focal atypical lobular hyperplasia   BREAST LUMPECTOMY WITH RADIOACTIVE SEED LOCALIZATION Left 08/20/2021   Procedure: LEFT BREAST RADIOACTIVE SEED LOCALIZED LUMPECTOMY;  Surgeon: Belinda Cough, MD;  Location: [facility] OR;  Service: General;  Laterality: Left;  LMA   CESAREAN SECTION     COLONOSCOPY WITH PROPOFOL  N/A 03/22/2021   sigmoid diverticulosis and normal rectum and anal verge.   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   Buttock pain 01/08/2022   Allergic rhinitis due to pollen 07/26/2021   Benign essential hypertension 07/26/2021   High cholesterol 07/26/2021   Peripheral neuropathy 07/26/2021   Type 2 diabetes mellitus (HCC) 07/26/2021   Change in bowel  movement 12/06/2020   Abdominal pain 12/06/2020   Patellofemoral arthritis of left knee 03/09/2012    PCP: Gerome Tillman CROME, FNP  REFERRING PROVIDER: Ernesto Grayson, MD   REFERRING DIAG: K62.89 (ICD-10-CM) - Other specified diseases of anus and rectum  THERAPY DIAG:  Pelvic pain  Other muscle spasm  Unspecified lack of coordination  Muscle weakness (generalized)  Other low back pain  Rationale for Evaluation and Treatment: Rehabilitation  ONSET DATE: 12/02/2021  SUBJECTIVE:  SUBJECTIVE STATEMENT: Pt states that she did feel some improvement with rectal pain after last treatment session. She is still having trouble sitting/driving.    PAIN: 02/09/2024 Are you having pain? Yes NPRS scale: 6/10 Pain location: rectal and low back pain  Pain type: aching Pain description: constant   Aggravating factors: sitting, driving, sleeping Relieving factors: when she has a bowel movement, standing  PRECAUTIONS: None  RED FLAGS: None   WEIGHT BEARING RESTRICTIONS: No  FALLS:  Has patient fallen in last 6 months? No  OCCUPATION: not working  ACTIVITY LEVEL : occasional exercises that she had been given in PT  PLOF: Independent  PATIENT GOALS: decrease pain at rectum  PERTINENT HISTORY:  C-section, tubal ligation, peripheral neuropathy, proctalgia, hemorrhoids Sexual abuse: No  BOWEL MOVEMENT: Pain with bowel movement: No Type of bowel movement:Type (Bristol Stool Scale) 4, Frequency 1x/day, and Strain no Fully empty rectum: Yes:   Leakage: No Pads: No Fiber supplement/laxative stool softener  URINATION: Pain with urination: No Fully empty bladder: No Stream: start and stop Urgency: Yes  Frequency: every hour at night; every 2 hours during the day Fluid Intake: 4 glasses a  day Leakage: Coughing Pads: No  INTERCOURSE:  NA  PREGNANCY: Vaginal deliveries 2 Tearing No Episiotomy No C-section deliveries 1 Currently pregnant No  PROLAPSE: Pressure and Bulge - at anus   OBJECTIVE:  Note: Objective measures were completed at Evaluation unless otherwise noted.  12/03/23 PATIENT SURVEYS:   PFIQ-7: 90 (pelvic)  COGNITION: Overall cognitive status: Within functional limits for tasks assessed     SENSATION: Light touch: Appears intact   FUNCTIONAL TESTS:  Squat: decreased LE stability  Single leg stance:  Rt: pelvic drop  Lt: pelvic drop Curl-up test: abdominal distortion    GAIT: Assistive device utilized: None Comments: WNL  POSTURE: rounded shoulders, forward head, decreased lumbar lordosis, increased thoracic kyphosis, and posterior pelvic tilt   LUMBARAROM/PROM:  A/PROM A/PROM  Eval (% available)  Flexion 80  Extension 10  Right lateral flexion 50  Left lateral flexion 50  Right rotation 25  Left rotation 25   (Blank rows = not tested)  PALPATION:   General: tightness in bil lumbar paraspinals  Pelvic Alignment: Rt posterior rotation; posterior pelvic tilt  Abdominal: apical breathing pattern                External Perineal Exam: mild external hemorrhoids non irritated                              Internal Pelvic Floor: dryness vaginally, no tenderness; significant tone in external anal sphincter and pelvic floor muscles rectally  Patient confirms identification and approves PT to assess internal pelvic floor and treatment Yes  PELVIC MMT:   MMT eval  Vaginal 1/5, 13 second hold, 4 repeat contractions  Internal Anal Sphincter 1/5  External Anal Sphincter 1/5  Puborectalis 1/5  Diastasis Recti 3 finger with distortion  (Blank rows = not tested)        TONE: Low vaginally, high external anal sphincter and rectally   PROLAPSE: Grade 2 anterior vaginal wall laxity  TODAY'S TREATMENT:  DATE:  02/09/2024 Manual: Pt provides verbal consent for internal vaginal pelvic floor exam. Supine vaginal posterior pelvic floor muscle release bil  Prone negative pressure soft tissue mobilization to low back and sacrum Soft tissue mobilization to lumbar paraspinals, glutes, sacral attachments  Neuromuscular re-education: Diaphragmatic breathing and pelvic floor muscle lengthening with inhales using mutlimodal cues  Exercises: Cat cow 2 x 10 with manual cues Lower trunk rotation 2 x 10 Single knee to chest 5x bil Supine piriformis stretch 60 sec bil Butterfly stretch 10 breaths  Seated forward fold 10 breaths   2/31/25 Manual: Pt provides verbal consent for internal vaginal/rectal pelvic floor exam. Internal rectal external anal sphincter and puborectalis release in Lt sidelying Coccyx mobilization and traction in Lt sidelying Supine vaginal posterior pelvic floor muscle release bil  Neuromuscular re-education: Diaphragmatic breathing and pelvic floor muscle lengthening with inhales using mutlimodal cues    01/26/24 Patient confirms identification and approves physical therapist to perform internal soft tissue work   Manual- tailbone mobilizations/ rectal work in left sidelying Diaphragmatic breathing  Review of HEP, progress     PATIENT EDUCATION:  Education details: See above Person educated: Patient Education method: Programmer, Multimedia, Facilities Manager, Actor cues, Verbal cues, and Handouts Education comprehension: verbalized understanding  HOME EXERCISE PROGRAM: [id number]  ASSESSMENT:  CLINICAL IMPRESSION:  Patient is a 74 y.o. female who was seen today for physical therapy treatment for rectal pain and hemorrhoids that prevent her from sitting, driving, or sleeping without difficulty and pain. Pt did see some progress after last session with increasing her sitting  tolerance, but is still having pain. She was able to perform mobility exercises wel ltoday without any increase in pain and HEP updated. She did need consistent tactile cues to help with pelvic tilting in cat cow; believe this has more to do with motor control than decreased mobility. We did not perform rectal pelvic floor muscle release but focused on vaginal release due to poor tolerance last session; in future sessions we may be able to return to in future as muscle spasm calms down. She did demonstrate decreased vaginal pelvic floor muscle spasm, but still present; much better tolerance today. Cuppign and soft tissue mobilization performed to low back, glutes, and sacrum with good tolerance to help further reduce tightness surrounding rectum. She will continue to benefit from skilled PT intervention in order to decrease pain, improve activity tolerance, and address deficits.   OBJECTIVE IMPAIRMENTS: decreased activity tolerance, decreased coordination, decreased endurance, decreased mobility, decreased ROM, decreased strength, increased fascial restrictions, increased muscle spasms, impaired flexibility, impaired tone, improper body mechanics, postural dysfunction, and pain.   ACTIVITY LIMITATIONS: carrying, lifting, bending, sitting, standing, squatting, transfers, bed mobility, continence, and locomotion level  PARTICIPATION LIMITATIONS: cleaning, medication management, driving, community activity, and exercise  PERSONAL FACTORS: 3+ comorbidities: medical histo are also affecting patient's functional outcome.   REHAB POTENTIAL: Good  CLINICAL DECISION MAKING: Evolving/moderate complexity  EVALUATION COMPLEXITY: Moderate   GOALS: Goals reviewed with patient? Yes  SHORT TERM GOALS: Target date: 12/31/2023   Pt will be independent with HEP in order to improve activity tolerance.   Baseline: Goal status: IN PROGRESS 02/03/24  2.  Patient will report 25% improve in pelvic pain in order  to increase activity tolerance.   Baseline: 10/10 Goal status: IN PROGRESS 02/03/24  3.  Pt will be independent with use of squatty potty, relaxed toileting mechanics, and improved bowel movement techniques in order to increase ease of bowel movements and complete evacuation.  Baseline: patient not having bowel movement with optimal body mechanics Goal status: ongoing 01/26/2024  4.  Pt will be independent with gentle perineal cleansing techniques in order to decrease irritation and pain.  Baseline: using toilet paper, no moisturization  Goal status: PROGRESS 12/31/25AL  5.  Pt will be able to teach back and utilize urge suppression technique in order to help reduce number of trips to the bathroom.    Baseline: hourly at night, urgency and every 2 hours during the day Goal status: IN PROGRESS 02/03/24   LONG TERM GOALS: Target date: 02/25/2024  Pt will be independent with advanced HEP in order to improve activity tolerance.   Baseline:  Goal status: IN PROGRESS 02/03/24  2.  Patient will report 75% improve in pelvic pain in order to increase activity tolerance.   Baseline: 10/10 Goal status: IN PROGRESS 02/03/24  3.  Pt will be able to sit for >30 minutes with pain no greater than 2/10 in order to eat meals.  Baseline:  Goal status: IN PROGRESS 02/03/24  4.  Pt will be able to drive for 30 minutes or longer with pain no greater than 2/10 in order to make it to appointments without difficulty.  Baseline: drives with notable pain Goal status: IN PROGRESS 02/03/24  5.  Pt will be able to sleep without waking due to the pain more than 1x/night in order to achieve better rest.  Baseline: wakes every hour Goal status: IN PROGRESS 02/03/24  6.  Pt will decrease frequency of nightly trips to the bathroom to 1 or less in order to get restful sleep.   Baseline:  Goal status: IN PROGRESS 02/03/24  PLAN:  PT FREQUENCY: 1-2x/week  PT DURATION: 12 visits   PLANNED  INTERVENTIONS: 97164- PT Re-evaluation, 97110-Therapeutic exercises, 97530- Therapeutic activity, 97112- Neuromuscular re-education, 97535- Self Care, 02859- Manual therapy, [phone]- Gait training, [phone]- Aquatic Therapy, [phone]- Electrical stimulation (unattended), [phone]- Traction (mechanical), [id number]- Ionotophoresis 4mg /ml Dexamethasone , 79439 (1-2 muscles), 20561 (3+ muscles)- Dry Needling, Patient/Family education, Balance training, Taping, Joint mobilization, Joint manipulation, Spinal manipulation, Spinal mobilization, Scar mobilization, Vestibular training, Cryotherapy, Moist heat, and Biofeedback  PLAN FOR NEXT SESSION: manual techniques to low back or external anal sphincter/pelvic floor muscles; mobility activities, down training  Josette Mares, PT, DPT1/6/20269:23 AM [facility] [address], [address] [place], [place] [address] Phone # [phone] Fax [phone]   "

## 2024-02-11 ENCOUNTER — Other Ambulatory Visit: Payer: Self-pay | Admitting: Gastroenterology

## 2024-02-16 ENCOUNTER — Ambulatory Visit

## 2024-02-16 DIAGNOSIS — R279 Unspecified lack of coordination: Secondary | ICD-10-CM

## 2024-02-16 DIAGNOSIS — M5459 Other low back pain: Secondary | ICD-10-CM

## 2024-02-16 DIAGNOSIS — M6281 Muscle weakness (generalized): Secondary | ICD-10-CM

## 2024-02-16 DIAGNOSIS — R102 Pelvic and perineal pain unspecified side: Secondary | ICD-10-CM

## 2024-02-16 DIAGNOSIS — M62838 Other muscle spasm: Secondary | ICD-10-CM

## 2024-02-16 NOTE — Therapy (Signed)
 " OUTPATIENT PHYSICAL THERAPY FEMALE PELVIC TREATMENT   Patient Name: Christina Erickson MRN: [id number] DOB:[birth year], 74 y.o., female Today's Date: 02/16/2024  END OF SESSION:  PT End of Session - 02/16/24 0938     Visit Number 5    Date for Recertification  02/25/24    Authorization Type [employer] Medicare    Authorization Time Period [employer] Dual MCR  No Auth Required    Progress Note Due on Visit 10    PT Start Time 0931    PT Stop Time 1010    PT Time Calculation (min) 39 min    Activity Tolerance Patient tolerated treatment well    Behavior During Therapy [facility] for tasks assessed/performed           Past Medical History:  Diagnosis Date   Arthritis    Back pain 06/25/2017   High blood pressure    High cholesterol    Pre-diabetes    Past Surgical History:  Procedure Laterality Date   BREAST CYST ASPIRATION Right 06/2021   BREAST EXCISIONAL BIOPSY Left 08/20/2021   Focal atypical lobular hyperplasia   BREAST LUMPECTOMY WITH RADIOACTIVE SEED LOCALIZATION Left 08/20/2021   Procedure: LEFT BREAST RADIOACTIVE SEED LOCALIZED LUMPECTOMY;  Surgeon: Belinda Cough, MD;  Location: [facility] OR;  Service: General;  Laterality: Left;  LMA   CESAREAN SECTION     COLONOSCOPY WITH PROPOFOL  N/A 03/22/2021   sigmoid diverticulosis and normal rectum and anal verge.   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   Buttock pain 01/08/2022   Allergic rhinitis due to pollen 07/26/2021   Benign essential hypertension 07/26/2021   High cholesterol 07/26/2021   Peripheral neuropathy 07/26/2021   Type 2 diabetes mellitus (HCC) 07/26/2021   Change in bowel  movement 12/06/2020   Abdominal pain 12/06/2020   Patellofemoral arthritis of left knee 03/09/2012    PCP: Gerome Tillman CROME, FNP  REFERRING PROVIDER: Ernesto Grayson, MD   REFERRING DIAG: K62.89 (ICD-10-CM) - Other specified diseases of anus and rectum  THERAPY DIAG:  Pelvic pain  Other muscle spasm  Unspecified lack of coordination  Muscle weakness (generalized)  Other low back pain  Rationale for Evaluation and Treatment: Rehabilitation  ONSET DATE: 12/02/2021  SUBJECTIVE:  SUBJECTIVE STATEMENT: Pt states that she is feeling small improvements, but nothing notable.    PAIN: 02/16/2024 Are you having pain? Yes NPRS scale: 6/10 Pain location: rectal and low back pain  Pain type: aching Pain description: constant   Aggravating factors: sitting, driving, sleeping Relieving factors: when she has a bowel movement, standing  PRECAUTIONS: None  RED FLAGS: None   WEIGHT BEARING RESTRICTIONS: No  FALLS:  Has patient fallen in last 6 months? No  OCCUPATION: not working  ACTIVITY LEVEL : occasional exercises that she had been given in PT  PLOF: Independent  PATIENT GOALS: decrease pain at rectum  PERTINENT HISTORY:  C-section, tubal ligation, peripheral neuropathy, proctalgia, hemorrhoids Sexual abuse: No  BOWEL MOVEMENT: Pain with bowel movement: No Type of bowel movement:Type (Bristol Stool Scale) 4, Frequency 1x/day, and Strain no Fully empty rectum: Yes:   Leakage: No Pads: No Fiber supplement/laxative stool softener  URINATION: Pain with urination: No Fully empty bladder: No Stream: start and stop Urgency: Yes  Frequency: every hour at night; every 2 hours during the day Fluid Intake: 4 glasses a day Leakage: Coughing Pads:  No  INTERCOURSE:  NA  PREGNANCY: Vaginal deliveries 2 Tearing No Episiotomy No C-section deliveries 1 Currently pregnant No  PROLAPSE: Pressure and Bulge - at anus   OBJECTIVE:  Note: Objective measures were completed at Evaluation unless otherwise noted.  12/03/23 PATIENT SURVEYS:   PFIQ-7: 90 (pelvic)  COGNITION: Overall cognitive status: Within functional limits for tasks assessed     SENSATION: Light touch: Appears intact   FUNCTIONAL TESTS:  Squat: decreased LE stability  Single leg stance:  Rt: pelvic drop  Lt: pelvic drop Curl-up test: abdominal distortion    GAIT: Assistive device utilized: None Comments: WNL  POSTURE: rounded shoulders, forward head, decreased lumbar lordosis, increased thoracic kyphosis, and posterior pelvic tilt   LUMBARAROM/PROM:  A/PROM A/PROM  Eval (% available)  Flexion 80  Extension 10  Right lateral flexion 50  Left lateral flexion 50  Right rotation 25  Left rotation 25   (Blank rows = not tested)  PALPATION:   General: tightness in bil lumbar paraspinals  Pelvic Alignment: Rt posterior rotation; posterior pelvic tilt  Abdominal: apical breathing pattern                External Perineal Exam: mild external hemorrhoids non irritated                              Internal Pelvic Floor: dryness vaginally, no tenderness; significant tone in external anal sphincter and pelvic floor muscles rectally  Patient confirms identification and approves PT to assess internal pelvic floor and treatment Yes  PELVIC MMT:   MMT eval  Vaginal 1/5, 13 second hold, 4 repeat contractions  Internal Anal Sphincter 1/5  External Anal Sphincter 1/5  Puborectalis 1/5  Diastasis Recti 3 finger with distortion  (Blank rows = not tested)        TONE: Low vaginally, high external anal sphincter and rectally   PROLAPSE: Grade 2 anterior vaginal wall laxity  TODAY'S TREATMENT:  DATE:  02/16/2024 Manual: Pt provides verbal consent for internal vaginal/rectal pelvic floor exam. Internal rectal external anal sphincter and puborectalis release in Lt sidelying Coccyx mobilization and traction in Lt sidelying Anal sphincter trigger point release Neuromuscular re-education: Diaphragmatic breathing and pelvic floor muscle lengthening with inhales using mutlimodal cues   02/09/2024 Manual: Pt provides verbal consent for internal vaginal pelvic floor exam. Supine vaginal posterior pelvic floor muscle release bil  Prone negative pressure soft tissue mobilization to low back and sacrum Soft tissue mobilization to lumbar paraspinals, glutes, sacral attachments  Neuromuscular re-education: Diaphragmatic breathing and pelvic floor muscle lengthening with inhales using mutlimodal cues  Exercises: Cat cow 2 x 10 with manual cues Lower trunk rotation 2 x 10 Single knee to chest 5x bil Supine piriformis stretch 60 sec bil Butterfly stretch 10 breaths  Seated forward fold 10 breaths   2/31/25 Manual: Pt provides verbal consent for internal vaginal/rectal pelvic floor exam. Internal rectal external anal sphincter and puborectalis release in Lt sidelying Coccyx mobilization and traction in Lt sidelying Supine vaginal posterior pelvic floor muscle release bil  Neuromuscular re-education: Diaphragmatic breathing and pelvic floor muscle lengthening with inhales using mutlimodal cues    PATIENT EDUCATION:  Education details: See above Person educated: Patient Education method: Programmer, Multimedia, Demonstration, Actor cues, Verbal cues, and Handouts Education comprehension: verbalized understanding  HOME EXERCISE PROGRAM: [id number]  ASSESSMENT:  CLINICAL IMPRESSION:  Patient is a 74 y.o. female who was seen today for physical therapy treatment for rectal pain and hemorrhoids that prevent her from sitting,  driving, or sleeping without difficulty and pain. Pt seeing some improvement, but very little. She appeared to have more improvement after internal rectal release. We spent today focusing on this with intra=rectal pelvic floor muscle and external anal sphincter release. She tolerated much better than when we last performed. We were able to reproduce her pain with releasing tight areas in muscles. She reported improved pain at end of session. She will continue to benefit from skilled PT intervention in order to decrease pain, improve activity tolerance, and address deficits.   OBJECTIVE IMPAIRMENTS: decreased activity tolerance, decreased coordination, decreased endurance, decreased mobility, decreased ROM, decreased strength, increased fascial restrictions, increased muscle spasms, impaired flexibility, impaired tone, improper body mechanics, postural dysfunction, and pain.   ACTIVITY LIMITATIONS: carrying, lifting, bending, sitting, standing, squatting, transfers, bed mobility, continence, and locomotion level  PARTICIPATION LIMITATIONS: cleaning, medication management, driving, community activity, and exercise  PERSONAL FACTORS: 3+ comorbidities: medical histo are also affecting patient's functional outcome.   REHAB POTENTIAL: Good  CLINICAL DECISION MAKING: Evolving/moderate complexity  EVALUATION COMPLEXITY: Moderate   GOALS: Goals reviewed with patient? Yes  SHORT TERM GOALS: Target date: 12/31/2023   Pt will be independent with HEP in order to improve activity tolerance.   Baseline: Goal status: IN PROGRESS 02/03/24  2.  Patient will report 25% improve in pelvic pain in order to increase activity tolerance.   Baseline: 10/10 Goal status: IN PROGRESS 02/03/24  3.  Pt will be independent with use of squatty potty, relaxed toileting mechanics, and improved bowel movement techniques in order to increase ease of bowel movements and complete evacuation.    Baseline: patient not  having bowel movement with optimal body mechanics Goal status: ongoing 01/26/2024  4.  Pt will be independent with gentle perineal cleansing techniques in order to decrease irritation and pain.  Baseline: using toilet paper, no moisturization  Goal status: PROGRESS 12/31/25AL  5.  Pt will be able to teach back and utilize  urge suppression technique in order to help reduce number of trips to the bathroom.    Baseline: hourly at night, urgency and every 2 hours during the day Goal status: IN PROGRESS 02/03/24   LONG TERM GOALS: Target date: 02/25/2024  Pt will be independent with advanced HEP in order to improve activity tolerance.   Baseline:  Goal status: IN PROGRESS 02/03/24  2.  Patient will report 75% improve in pelvic pain in order to increase activity tolerance.   Baseline: 10/10 Goal status: IN PROGRESS 02/03/24  3.  Pt will be able to sit for >30 minutes with pain no greater than 2/10 in order to eat meals.  Baseline:  Goal status: IN PROGRESS 02/03/24  4.  Pt will be able to drive for 30 minutes or longer with pain no greater than 2/10 in order to make it to appointments without difficulty.  Baseline: drives with notable pain Goal status: IN PROGRESS 02/03/24  5.  Pt will be able to sleep without waking due to the pain more than 1x/night in order to achieve better rest.  Baseline: wakes every hour Goal status: IN PROGRESS 02/03/24  6.  Pt will decrease frequency of nightly trips to the bathroom to 1 or less in order to get restful sleep.   Baseline:  Goal status: IN PROGRESS 02/03/24  PLAN:  PT FREQUENCY: 1-2x/week  PT DURATION: 12 visits   PLANNED INTERVENTIONS: 97164- PT Re-evaluation, 97110-Therapeutic exercises, 97530- Therapeutic activity, 97112- Neuromuscular re-education, 97535- Self Care, 02859- Manual therapy, [phone]- Gait training, [phone]- Aquatic Therapy, [phone]- Electrical stimulation (unattended), [phone]- Traction (mechanical), [id number]- Ionotophoresis  4mg /ml Dexamethasone , 79439 (1-2 muscles), 20561 (3+ muscles)- Dry Needling, Patient/Family education, Balance training, Taping, Joint mobilization, Joint manipulation, Spinal manipulation, Spinal mobilization, Scar mobilization, Vestibular training, Cryotherapy, Moist heat, and Biofeedback  PLAN FOR NEXT SESSION: manual techniques to low back or external anal sphincter/pelvic floor muscles; mobility activities, down training  Josette Mares, PT, DPT1/13/202610:13 AM [facility] [address], [address] [place], [place] [address] Phone # [phone] Fax [phone]   "

## 2024-02-23 ENCOUNTER — Ambulatory Visit

## 2024-02-23 DIAGNOSIS — M6281 Muscle weakness (generalized): Secondary | ICD-10-CM

## 2024-02-23 DIAGNOSIS — R102 Pelvic and perineal pain unspecified side: Secondary | ICD-10-CM | POA: Diagnosis not present

## 2024-02-23 DIAGNOSIS — R279 Unspecified lack of coordination: Secondary | ICD-10-CM

## 2024-02-23 DIAGNOSIS — M5459 Other low back pain: Secondary | ICD-10-CM

## 2024-02-23 DIAGNOSIS — M62838 Other muscle spasm: Secondary | ICD-10-CM

## 2024-02-23 NOTE — Therapy (Signed)
 " OUTPATIENT PHYSICAL THERAPY FEMALE PELVIC TREATMENT   Patient Name: Christina Erickson MRN: [id number] DOB:02-22-1950, 74 y.o., female Today's Date: 02/23/2024  END OF SESSION:  PT End of Session - 02/23/24 0935     Visit Number 6    Date for Recertification  05/17/24    Authorization Type [employer] Medicare    Authorization Time Period [employer] Dual MCR  No Auth Required    Progress Note Due on Visit 10    PT Start Time 0930    PT Stop Time 1008    PT Time Calculation (min) 38 min    Activity Tolerance Patient tolerated treatment well    Behavior During Therapy [facility] for tasks assessed/performed           Past Medical History:  Diagnosis Date   Arthritis    Back pain 06/25/2017   High blood pressure    High cholesterol    Pre-diabetes    Past Surgical History:  Procedure Laterality Date   BREAST CYST ASPIRATION Right 06/2021   BREAST EXCISIONAL BIOPSY Left 08/20/2021   Focal atypical lobular hyperplasia   BREAST LUMPECTOMY WITH RADIOACTIVE SEED LOCALIZATION Left 08/20/2021   Procedure: LEFT BREAST RADIOACTIVE SEED LOCALIZED LUMPECTOMY;  Surgeon: Belinda Cough, MD;  Location: [facility] OR;  Service: General;  Laterality: Left;  LMA   CESAREAN SECTION     COLONOSCOPY WITH PROPOFOL  N/A 03/22/2021   sigmoid diverticulosis and normal rectum and anal verge.   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   Buttock pain 01/08/2022   Allergic rhinitis due to pollen 07/26/2021   Benign essential hypertension 07/26/2021   High cholesterol 07/26/2021   Peripheral neuropathy 07/26/2021   Type 2 diabetes mellitus (HCC) 07/26/2021   Change in bowel  movement 12/06/2020   Abdominal pain 12/06/2020   Patellofemoral arthritis of left knee 03/09/2012    PCP: Gerome Tillman CROME, FNP  REFERRING PROVIDER: Ernesto Grayson, MD   REFERRING DIAG: K62.89 (ICD-10-CM) - Other specified diseases of anus and rectum  THERAPY DIAG:  Pelvic pain - Plan: PT plan of care cert/re-cert  Other muscle spasm - Plan: PT plan of care cert/re-cert  Unspecified lack of coordination - Plan: PT plan of care cert/re-cert  Muscle weakness (generalized) - Plan: PT plan of care cert/re-cert  Other low back pain - Plan: PT plan of care cert/re-cert  Rationale for Evaluation and Treatment: Rehabilitation  ONSET DATE: 12/02/2021  SUBJECTIVE:  SUBJECTIVE STATEMENT: Pt reports no consistent change after last treatment session.    PAIN: 02/23/2024 Are you having pain? Yes NPRS scale: 6/10 Pain location: rectal and low back pain  Pain type: aching Pain description: constant   Aggravating factors: sitting, driving, sleeping Relieving factors: when she has a bowel movement, standing  PRECAUTIONS: None  RED FLAGS: None   WEIGHT BEARING RESTRICTIONS: No  FALLS:  Has patient fallen in last 6 months? No  OCCUPATION: not working  ACTIVITY LEVEL : occasional exercises that she had been given in PT  PLOF: Independent  PATIENT GOALS: decrease pain at rectum  PERTINENT HISTORY:  C-section, tubal ligation, peripheral neuropathy, proctalgia, hemorrhoids Sexual abuse: No  BOWEL MOVEMENT: Pain with bowel movement: No Type of bowel movement:Type (Bristol Stool Scale) 4, Frequency 1x/day, and Strain no Fully empty rectum: Yes:   Leakage: No Pads: No Fiber supplement/laxative stool softener  URINATION: Pain with urination: No Fully empty bladder: No Stream: start and  stop Urgency: Yes  Frequency: every hour at night; every 2 hours during the day Fluid Intake: 4 glasses a day Leakage: Coughing Pads: No  INTERCOURSE:  NA  PREGNANCY: Vaginal deliveries 2 Tearing No Episiotomy No C-section deliveries 1 Currently pregnant No  PROLAPSE: Pressure and Bulge - at anus   OBJECTIVE:  Note: Objective measures were completed at Evaluation unless otherwise noted.  02/23/24 PFIQ-7: 62 (POPIQ-7/pelvic) -no internal vaginal or rectal exam today, but based on last session she still demonstrates notable tone in external anal sphincter and levator ani -continued tightness in bil lumbar paraspinals -bil pelvic drop in single leg stance  -LUMBARAROM/PROM:  A/PROM A/PROM  02/23/24 (% available) A/PROM  Eval (% available)  Flexion  80  Extension  10  Right lateral flexion  50  Left lateral flexion  50  Right rotation  25  Left rotation  25   (Blank rows = not tested)  12/03/23 PATIENT SURVEYS:   PFIQ-7: 90 (pelvic)  COGNITION: Overall cognitive status: Within functional limits for tasks assessed     SENSATION: Light touch: Appears intact   FUNCTIONAL TESTS:  Squat: decreased LE stability  Single leg stance:  Rt: pelvic drop  Lt: pelvic drop Curl-up test: abdominal distortion    GAIT: Assistive device utilized: None Comments: WNL  POSTURE: rounded shoulders, forward head, decreased lumbar lordosis, increased thoracic kyphosis, and posterior pelvic tilt   LUMBARAROM/PROM:  A/PROM A/PROM  Eval (% available)  Flexion 80  Extension 10  Right lateral flexion 50  Left lateral flexion 50  Right rotation 25  Left rotation 25   (Blank rows = not tested)  PALPATION:   General: tightness in bil lumbar paraspinals  Pelvic Alignment: Rt posterior rotation; posterior pelvic tilt  Abdominal: apical breathing pattern                External Perineal Exam: mild external hemorrhoids non irritated                              Internal  Pelvic Floor: dryness vaginally, no tenderness; significant tone in external anal sphincter and pelvic floor muscles rectally  Patient confirms identification and approves PT to assess internal pelvic floor and treatment Yes  PELVIC MMT:   MMT eval  Vaginal 1/5, 13 second hold, 4 repeat contractions  Internal Anal Sphincter 1/5  External Anal Sphincter 1/5  Puborectalis 1/5  Diastasis Recti 3 finger with distortion  (Blank rows = not tested)  TONE: Low vaginally, high external anal sphincter and rectally   PROLAPSE: Grade 2 anterior vaginal wall laxity  TODAY'S TREATMENT:                                                                                                                              DATE:  02/23/24 Neuromuscular re-education: Supine hip adduction ball press with transversus abdominus and pelvic floor muscle contractions and breath coordination 10x Supine resisted hip abduction + red band 2 x 10 Supine resisted march + red band 2 x 10 Supine bent knee fall out + red band 10x bil Bridge + hip adduction 2 x 10 Exercises: Lower trunk rotation 2 x 10 Single knee to chest 10x bil Supine piriformis stretch 60 sec bil  Clam shells 2 x 10 bil   02/16/2024 Manual: Pt provides verbal consent for internal vaginal/rectal pelvic floor exam. Internal rectal external anal sphincter and puborectalis release in Lt sidelying Coccyx mobilization and traction in Lt sidelying Anal sphincter trigger point release Neuromuscular re-education: Diaphragmatic breathing and pelvic floor muscle lengthening with inhales using mutlimodal cues   02/09/2024 Manual: Pt provides verbal consent for internal vaginal pelvic floor exam. Supine vaginal posterior pelvic floor muscle release bil  Prone negative pressure soft tissue mobilization to low back and sacrum Soft tissue mobilization to lumbar paraspinals, glutes, sacral attachments  Neuromuscular re-education: Diaphragmatic breathing  and pelvic floor muscle lengthening with inhales using mutlimodal cues  Exercises: Cat cow 2 x 10 with manual cues Lower trunk rotation 2 x 10 Single knee to chest 5x bil Supine piriformis stretch 60 sec bil Butterfly stretch 10 breaths  Seated forward fold 10 breaths   PATIENT EDUCATION:  Education details: See above Person educated: Patient Education method: Programmer, Multimedia, Demonstration, Tactile cues, Verbal cues, and Handouts Education comprehension: verbalized understanding  HOME EXERCISE PROGRAM: [id number]  ASSESSMENT:  CLINICAL IMPRESSION:  Patient is a 74 y.o. female who was seen today for physical therapy treatment for rectal pain and hemorrhoids that prevent her from sitting, driving, or sleeping without difficulty and pain. Pt states that she has seen very little progress so far. However, she does report increase in sitting time, waking up less due to pain, improved bowel movements, and improved nocturia. Due to slow progress, though, we are going to change focus of treatment in order to see if she does not start responding more quickly. We have been focusing on her increased pelvic floor muscle and external anal sphincter tone, but she tolerated this poorly and does not seem to be getting long term benefit from it. Today we focused on more strengthening activities with breath coordination to help increase mobility and circulation to the pelvic floor. Excellent tolerance with no increase in pain. HEP updated. She will continue to benefit from skilled PT intervention in order to decrease pain, improve activity tolerance, and address deficits.   OBJECTIVE IMPAIRMENTS: decreased activity tolerance, decreased coordination, decreased endurance, decreased mobility, decreased ROM, decreased strength,  increased fascial restrictions, increased muscle spasms, impaired flexibility, impaired tone, improper body mechanics, postural dysfunction, and pain.   ACTIVITY LIMITATIONS: carrying,  lifting, bending, sitting, standing, squatting, transfers, bed mobility, continence, and locomotion level  PARTICIPATION LIMITATIONS: cleaning, medication management, driving, community activity, and exercise  PERSONAL FACTORS: 3+ comorbidities: medical histo are also affecting patient's functional outcome.   REHAB POTENTIAL: Good  CLINICAL DECISION MAKING: Evolving/moderate complexity  EVALUATION COMPLEXITY: Moderate   GOALS: Goals reviewed with patient? Yes  SHORT TERM GOALS: Target date: 12/31/2023   Pt will be independent with HEP in order to improve activity tolerance.   Baseline: Goal status: Met 02/23/24  2.  Patient will report 25% improve in pelvic pain in order to increase activity tolerance.   Baseline: 10/10; pt reports she is 10% better Goal status: IN PROGRESS 02/23/24  3.  Pt will be independent with use of squatty potty, relaxed toileting mechanics, and improved bowel movement techniques in order to increase ease of bowel movements and complete evacuation.    Baseline: patient not having bowel movement with optimal body mechanics; pt feels like stool is helpful for bowel movments  Goal status: Met 02/23/24  4.  Pt will be independent with gentle perineal cleansing techniques in order to decrease irritation and pain.  Baseline: using toilet paper, no moisturization Goal status: Met 02/23/24  5.  Pt will be able to teach back and utilize urge suppression technique in order to help reduce number of trips to the bathroom.    Baseline: hourly at night, urgency and every 2 hours during the day Goal status: IN PROGRESS 02/23/24   LONG TERM GOALS: Target date: 02/25/2024  Pt will be independent with advanced HEP in order to improve activity tolerance.   Baseline:  Goal status: IN PROGRESS 02/23/24  2.  Patient will report 75% improve in pelvic pain in order to increase activity tolerance.   Baseline: 10/10; 10% better Goal status: IN PROGRESS 02/23/24  3.  Pt  will be able to sit for >30 minutes with pain no greater than 2/10 in order to eat meals.  Baseline: feels like some progress, sometimes she can sit for 30 minutes if she's taken medicine Goal status: IN PROGRESS 02/23/24  4.  Pt will be able to drive for 30 minutes or longer with pain no greater than 2/10 in order to make it to appointments without difficulty.  Baseline: drives with notable pain; reports it's the same Goal status: IN PROGRESS 02/23/24  5.  Pt will be able to sleep without waking due to the pain more than 1x/night in order to achieve better rest.  Baseline: wakes every hour; sleeping better Goal status: IN PROGRESS 02/23/24  6.  Pt will decrease frequency of nightly trips to the bathroom to 1 or less in order to get restful sleep.   Baseline: was every hour, and now just 3-4x Goal status: IN PROGRESS 02/23/24  PLAN:  PT FREQUENCY: 1-2x/week  PT DURATION: 12 visits (restart visits on 02/23/24)  PLANNED INTERVENTIONS: 97164- PT Re-evaluation, 97110-Therapeutic exercises, 97530- Therapeutic activity, 97112- Neuromuscular re-education, 97535- Self Care, [id number]- Manual therapy, [phone]- Gait training, [phone]- Aquatic Therapy, [phone]- Electrical stimulation (unattended), [phone]- Traction (mechanical), [id number]- Ionotophoresis 4mg /ml Dexamethasone , 79439 (1-2 muscles), [id number] (3+ muscles)- Dry Needling, Patient/Family education, Balance training, Taping, Joint mobilization, Joint manipulation, Spinal manipulation, Spinal mobilization, Scar mobilization, Vestibular training, Cryotherapy, Moist heat, and Biofeedback  PLAN FOR NEXT SESSION: manual techniques to low back or external anal sphincter/pelvic floor muscles; mobility activities, down  training  Josette Mares, PT, DPT01/20/2610:15 AM [facility] [address], [address] [place], [place] [address] Phone # [phone] Fax [phone]   "

## 2024-03-01 NOTE — Telephone Encounter (Signed)
 Provided refill but needs follow-up for any additional refills.

## 2024-04-12 ENCOUNTER — Ambulatory Visit

## 2024-04-19 ENCOUNTER — Ambulatory Visit

## 2024-04-26 ENCOUNTER — Ambulatory Visit

## 2024-05-03 ENCOUNTER — Ambulatory Visit

## 2024-05-10 ENCOUNTER — Ambulatory Visit
